# Patient Record
Sex: Female | Born: 1975 | Race: White | Hispanic: No | Marital: Married | Smoking: Current every day smoker
Health system: Southern US, Community
[De-identification: ages and names within clinical notes are randomized; demographics above are authoritative.]

## PROBLEM LIST (undated history)

## (undated) DIAGNOSIS — J45909 Unspecified asthma, uncomplicated: Secondary | ICD-10-CM

## (undated) DIAGNOSIS — K5792 Diverticulitis of intestine, part unspecified, without perforation or abscess without bleeding: Secondary | ICD-10-CM

## (undated) DIAGNOSIS — N200 Calculus of kidney: Secondary | ICD-10-CM

---

## 2003-02-10 HISTORY — PX: ABDOMINAL HYSTERECTOMY: SHX81

## 2005-05-29 ENCOUNTER — Encounter: Admission: RE | Admit: 2005-05-29 | Discharge: 2005-05-29 | Payer: Self-pay | Admitting: Family Medicine

## 2007-01-16 ENCOUNTER — Emergency Department (HOSPITAL_COMMUNITY): Admission: EM | Admit: 2007-01-16 | Discharge: 2007-01-16 | Payer: Self-pay | Admitting: Emergency Medicine

## 2009-09-03 ENCOUNTER — Emergency Department (HOSPITAL_BASED_OUTPATIENT_CLINIC_OR_DEPARTMENT_OTHER): Admission: EM | Admit: 2009-09-03 | Discharge: 2009-09-04 | Payer: Self-pay | Admitting: Emergency Medicine

## 2010-11-17 LAB — URINALYSIS, ROUTINE W REFLEX MICROSCOPIC
Bilirubin Urine: NEGATIVE
Glucose, UA: NEGATIVE
Ketones, ur: NEGATIVE
Leukocytes, UA: NEGATIVE
Nitrite: NEGATIVE
Protein, ur: NEGATIVE
Specific Gravity, Urine: 1.009
Urobilinogen, UA: 0.2
pH: 6

## 2010-11-17 LAB — I-STAT 8, (EC8 V) (CONVERTED LAB)
BUN: 6
Bicarbonate: 24.9 — ABNORMAL HIGH
Glucose, Bld: 74
HCT: 37
Hemoglobin: 12.6

## 2010-11-17 LAB — URINE MICROSCOPIC-ADD ON

## 2010-11-17 LAB — POCT I-STAT CREATININE: Creatinine, Ser: 0.7

## 2012-01-25 ENCOUNTER — Emergency Department (HOSPITAL_BASED_OUTPATIENT_CLINIC_OR_DEPARTMENT_OTHER)
Admission: EM | Admit: 2012-01-25 | Discharge: 2012-01-25 | Disposition: A | Discharge status: Home / Self Care | Payer: Self-pay | Attending: Emergency Medicine | Admitting: Emergency Medicine

## 2012-01-25 ENCOUNTER — Emergency Department (HOSPITAL_BASED_OUTPATIENT_CLINIC_OR_DEPARTMENT_OTHER): Payer: Self-pay

## 2012-01-25 ENCOUNTER — Encounter (HOSPITAL_BASED_OUTPATIENT_CLINIC_OR_DEPARTMENT_OTHER): Payer: Self-pay | Admitting: *Deleted

## 2012-01-25 DIAGNOSIS — R059 Cough, unspecified: Secondary | ICD-10-CM | POA: Insufficient documentation

## 2012-01-25 DIAGNOSIS — F17200 Nicotine dependence, unspecified, uncomplicated: Secondary | ICD-10-CM | POA: Insufficient documentation

## 2012-01-25 DIAGNOSIS — R071 Chest pain on breathing: Secondary | ICD-10-CM | POA: Insufficient documentation

## 2012-01-25 DIAGNOSIS — R05 Cough: Secondary | ICD-10-CM | POA: Insufficient documentation

## 2012-01-25 DIAGNOSIS — R0789 Other chest pain: Secondary | ICD-10-CM

## 2012-01-25 DIAGNOSIS — J45909 Unspecified asthma, uncomplicated: Secondary | ICD-10-CM | POA: Insufficient documentation

## 2012-01-25 HISTORY — DX: Unspecified asthma, uncomplicated: J45.909

## 2012-01-25 LAB — CBC WITH DIFFERENTIAL/PLATELET
Basophils Relative: 1 % (ref 0–1)
Eosinophils Absolute: 0.1 10*3/uL (ref 0.0–0.7)
Eosinophils Relative: 2 % (ref 0–5)
HCT: 37.7 % (ref 36.0–46.0)
Hemoglobin: 13.2 g/dL (ref 12.0–15.0)
MCH: 31.3 pg (ref 26.0–34.0)
MCHC: 35 g/dL (ref 30.0–36.0)
Monocytes Absolute: 0.7 10*3/uL (ref 0.1–1.0)
Monocytes Relative: 10 % (ref 3–12)
Neutro Abs: 4.2 10*3/uL (ref 1.7–7.7)
Neutrophils Relative %: 65 % (ref 43–77)
Platelets: 233 10*3/uL (ref 150–400)
RDW: 12.1 % (ref 11.5–15.5)
WBC: 6.5 10*3/uL (ref 4.0–10.5)

## 2012-01-25 LAB — BASIC METABOLIC PANEL
BUN: 11 mg/dL (ref 6–23)
CO2: 25 mEq/L (ref 19–32)
Chloride: 106 mEq/L (ref 96–112)
Creatinine, Ser: 0.8 mg/dL (ref 0.50–1.10)
GFR calc Af Amer: >90 mL/min (ref >90)
Potassium: 3.5 mEq/L (ref 3.5–5.1)

## 2012-01-25 LAB — TROPONIN I: Troponin I: <0.3 ng/mL (ref <0.30)

## 2012-01-25 MED ORDER — IBUPROFEN 800 MG PO TABS: 800.0000 mg | ORAL_TABLET | Freq: Three times a day (TID) | ORAL | Status: DC

## 2012-01-25 MED ORDER — OXYCODONE-ACETAMINOPHEN 5-325 MG PO TABS: 2.0000 | ORAL_TABLET | ORAL | Status: DC | PRN

## 2012-01-25 NOTE — ED Provider Notes (Signed)
History     CSN: [id number]  Arrival date & time 01/25/12  1706   First MD Initiated Contact with Patient 01/25/12 1747      Chief Complaint  Patient presents with  . Chest Pain  . Cough    (Consider location/radiation/quality/duration/timing/severity/associated sxs/prior treatment) Patient is a 36 y.o. female presenting with chest pain. The history is provided by the patient. No language interpreter was used.  Chest Pain The chest pain began more  than 1 month ago. Chest pain occurs intermittently. The chest pain is worsening. The pain is associated with stress. At its most intense, the pain is at 6/10. The pain is currently at 7/10. The severity of the pain is moderate. The quality of the pain is described as aching. The pain radiates to the left shoulder. Chest pain is worsened by certain positions. She tried aspirin for the symptoms. Risk factors include stress.  Pertinent negatives for past medical history include no CHF and no hypertension.    Pt reports increased stress.   Pt has had left sided chest pain for a month.   Pt began having pain in left arm and shoulder yesterday Past Medical History  Diagnosis Date  . Asthma     Past Surgical History  Procedure Date  . Abdominal hysterectomy     History reviewed. No pertinent family history.  History  Substance Use Topics  . Smoking status: Current Every Day Smoker -- 1.0 packs/day    Types: Cigarettes  . Smokeless tobacco: Not on file  . Alcohol Use: No    OB History    Grav Para Term Preterm Abortions TAB SAB Ect Mult Living                  Review of Systems  Cardiovascular: Positive for chest pain.  All other systems reviewed and are negative.    Allergies  Vicodin  Home Medications  No current outpatient prescriptions on file.  BP 132/88  Pulse 81  Temp 98.7 F (37.1 C)  Resp 16  Ht 5\' 7"  (1.702 m)  Wt 283 lb (128.368 kg)  BMI 44.32 kg/m2  SpO2 96%  Physical Exam  Nursing note and  vitals reviewed. Constitutional: She appears well-developed and well-nourished.  HENT:  Head: Normocephalic and atraumatic.  Right Ear: External ear normal.  Left Ear: External ear normal.  Nose: Nose normal.  Mouth/Throat: Oropharynx is clear and moist.  Eyes: Conjunctivae normal and EOM are normal. Pupils are equal, round, and reactive to light.  Neck: Normal range of motion. Neck supple.  Cardiovascular: Normal rate and normal heart sounds.   Pulmonary/Chest: Effort normal and breath sounds normal.  Abdominal: Soft. Bowel sounds are normal.  Musculoskeletal: Normal range of motion.  Neurological: She is alert.  Skin: Skin is warm.    ED Course  Procedures (including critical care time)  Labs Reviewed - No data to display Dg Chest 2 View  01/25/2012  *RADIOLOGY REPORT*  Clinical Data: Smoker with chest and left arm pain.  CHEST - 2 VIEW  Comparison: 06/13/2003.  Findings: Normal sized heart.  Clear lungs.  Mild thoracic spine degenerative changes.  IMPRESSION: No acute abnormality.   Original Report Authenticated By: Beckie Salts, M.D.      1. Chest wall pain       MDM   Date: 01/25/2012  Rate: 76  Rhythm: normal sinus rhythm  QRS Axis: normal  Intervals: normal  ST/T Wave abnormalities: nonspecific T wave changes  Conduction Disutrbances:none  Narrative  Interpretation:   Old EKG Reviewed: none available   Troponin negative,    Chest xray normal,   I doubt cardiac chest pain.   Pt given referrals for a primary MD.  Pt given rx for ibuprofen and percocet.        Lonia Skinner Blum, [place] 01/25/12 2013

## 2012-01-25 NOTE — ED Notes (Addendum)
Pt c/o URI symptoms x 1 month with chest tightness and upper back pain pain increases with deep breathing also c/o green sputum

## 2012-01-25 NOTE — ED Notes (Addendum)
Took 848mg  of aspirin at home today.

## 2012-01-25 NOTE — ED Notes (Signed)
Patient transported to X-ray 

## 2012-01-27 NOTE — ED Provider Notes (Signed)
Medical screening examination/treatment/procedure(s) were performed by non-physician practitioner and as supervising physician I was immediately available for consultation/collaboration.   Rocket Gunderson Y. Mora Pedraza, MD 01/27/12 2110 

## 2012-07-01 ENCOUNTER — Encounter (HOSPITAL_BASED_OUTPATIENT_CLINIC_OR_DEPARTMENT_OTHER): Payer: Self-pay | Admitting: Emergency Medicine

## 2012-07-01 ENCOUNTER — Emergency Department (HOSPITAL_BASED_OUTPATIENT_CLINIC_OR_DEPARTMENT_OTHER)
Admission: EM | Admit: 2012-07-01 | Discharge: 2012-07-01 | Disposition: A | Discharge status: Home / Self Care | Payer: Medicaid Other | Attending: Emergency Medicine | Admitting: Emergency Medicine

## 2012-07-01 DIAGNOSIS — M545 Low back pain, unspecified: Secondary | ICD-10-CM | POA: Insufficient documentation

## 2012-07-01 DIAGNOSIS — F17200 Nicotine dependence, unspecified, uncomplicated: Secondary | ICD-10-CM | POA: Insufficient documentation

## 2012-07-01 DIAGNOSIS — J45909 Unspecified asthma, uncomplicated: Secondary | ICD-10-CM | POA: Insufficient documentation

## 2012-07-01 LAB — URINALYSIS, ROUTINE W REFLEX MICROSCOPIC
Glucose, UA: NEGATIVE mg/dL
Nitrite: NEGATIVE

## 2012-07-01 LAB — URINE MICROSCOPIC-ADD ON

## 2012-07-01 MED ORDER — CYCLOBENZAPRINE HCL 10 MG PO TABS
10.0000 mg | ORAL_TABLET | Freq: Once | ORAL | Status: AC
  Administered 2012-07-01: 10 mg via ORAL
  Filled 2012-07-01: qty 1

## 2012-07-01 MED ORDER — KETOROLAC TROMETHAMINE 60 MG/2ML IM SOLN
60.0000 mg | Freq: Once | INTRAMUSCULAR | Status: AC
  Administered 2012-07-01: 60 mg via INTRAMUSCULAR
  Filled 2012-07-01: qty 2

## 2012-07-01 MED ORDER — CYCLOBENZAPRINE HCL 10 MG PO TABS: 10.0000 mg | ORAL_TABLET | Freq: Three times a day (TID) | ORAL | Status: DC | PRN

## 2012-07-01 NOTE — ED Notes (Signed)
Pt c/o lower back. Reports possible injury? A few days ago. States she took 1 percocet, 10mg  Valium, and Mobic 15mg  approx 0430 this am PTA, with little relief.

## 2012-07-01 NOTE — ED Notes (Signed)
MD at bedside. 
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
## 2012-08-25 ENCOUNTER — Encounter (HOSPITAL_BASED_OUTPATIENT_CLINIC_OR_DEPARTMENT_OTHER): Payer: Self-pay | Admitting: *Deleted

## 2012-08-25 ENCOUNTER — Emergency Department (HOSPITAL_BASED_OUTPATIENT_CLINIC_OR_DEPARTMENT_OTHER)
Admission: EM | Admit: 2012-08-25 | Discharge: 2012-08-25 | Disposition: A | Discharge status: Home / Self Care | Payer: Medicaid Other | Attending: Emergency Medicine | Admitting: Emergency Medicine

## 2012-08-25 DIAGNOSIS — F17200 Nicotine dependence, unspecified, uncomplicated: Secondary | ICD-10-CM | POA: Insufficient documentation

## 2012-08-25 DIAGNOSIS — J45909 Unspecified asthma, uncomplicated: Secondary | ICD-10-CM | POA: Insufficient documentation

## 2012-08-25 DIAGNOSIS — R0789 Other chest pain: Secondary | ICD-10-CM | POA: Insufficient documentation

## 2012-08-25 DIAGNOSIS — Z79899 Other long term (current) drug therapy: Secondary | ICD-10-CM | POA: Insufficient documentation

## 2012-08-25 DIAGNOSIS — J40 Bronchitis, not specified as acute or chronic: Secondary | ICD-10-CM

## 2012-08-25 MED ORDER — ALBUTEROL SULFATE HFA 108 (90 BASE) MCG/ACT IN AERS
INHALATION_SPRAY | RESPIRATORY_TRACT | Status: AC
  Administered 2012-08-25: 2 via RESPIRATORY_TRACT
  Filled 2012-08-25: qty 6.7

## 2012-08-25 MED ORDER — AZITHROMYCIN 250 MG PO TABS: ORAL_TABLET | ORAL | Status: DC

## 2012-08-25 MED ORDER — ALBUTEROL SULFATE HFA 108 (90 BASE) MCG/ACT IN AERS
2.0000 | INHALATION_SPRAY | RESPIRATORY_TRACT | Status: DC
  Administered 2012-08-25: 2 via RESPIRATORY_TRACT

## 2012-08-25 NOTE — Patient Instructions (Signed)
Instructed patient on the proper use of administering albuteral mdi via aerochamber patient tolerated well 

## 2012-08-25 NOTE — ED Notes (Signed)
C/o cough, wheezing, and congestion since Monday.

## 2012-08-25 NOTE — ED Notes (Signed)
MD at bedside. 

## 2012-08-25 NOTE — ED Provider Notes (Signed)
   History    CSN: [id number] Arrival date & time 08/25/12  0154  First MD Initiated Contact with Patient 08/25/12 0235     Chief Complaint  Patient presents with  . Cough   (Consider location/radiation/quality/duration/timing/severity/associated sxs/prior Treatment) HPI This is a 37 year old female with a three-day history of cough, wheezing and chest tightness. The symptoms have been moderate in severity and worse with exertion. The symptoms are also worse when she wakes up in the morning. She is not aware of having a fever but has felt hot at times. Her cough is productive of green sputum. She was evaluated by respiratory therapy was administered for puffs of albuterol prior to my evaluation. Wheezing heard by respiratory therapist resolved by the time of my evaluation. She had some nausea yesterday but none now. She denies vomiting or diarrhea.  Past Medical History  Diagnosis Date  . Asthma    Past Surgical History  Procedure Laterality Date  . Abdominal hysterectomy     History reviewed. No pertinent family history. History  Substance Use Topics  . Smoking status: Current Every Day Smoker -- 1.00 packs/day    Types: Cigarettes  . Smokeless tobacco: Not on file  . Alcohol Use: No   OB History   Grav Para Term Preterm Abortions TAB SAB Ect Mult Living                 Review of Systems  All other systems reviewed and are negative.    Allergies  Vicodin  Home Medications   Current Outpatient Rx  Name  Route  Sig  Dispense  Refill  . FLUoxetine (PROZAC) 20 MG capsule   Oral   Take 20 mg by mouth daily.         . cyclobenzaprine (FLEXERIL) 10 MG tablet   Oral   Take 1 tablet (10 mg total) by mouth 3 (three) times daily as needed for muscle spasms.   20 tablet   0   . ibuprofen (ADVIL,MOTRIN) 800 MG tablet   Oral   Take 1 tablet (800 mg total) by mouth 3 (three) times daily.   21 tablet   0   . oxyCODONE-acetaminophen (PERCOCET/ROXICET) 5-325 MG per  tablet   Oral   Take 2 tablets by mouth every 4 (four) hours as needed for pain.   10 tablet   0    BP 150/97  Pulse 75  Temp(Src) 98.7 F (37.1 C) (Oral)  Resp 20  Ht 5\' 7"  (1.702 m)  Wt 290 lb (131.543 kg)  BMI 45.41 kg/m2  SpO2 98%  Physical Exam General: Well-developed, well-nourished female in no acute distress; appearance consistent with age of record HENT: normocephalic, atraumatic Eyes: pupils equal round and reactive to light; extraocular muscles intact Neck: supple Heart: regular rate and rhythm Lungs: clear to auscultation bilaterally Abdomen: soft; obese; nontender; bowel sounds present Extremities: No deformity; full range of motion; pulses normal Neurologic: Awake, alert and oriented; motor function intact in all extremities and symmetric; no facial droop Skin: Warm and dry Psychiatric: Normal mood and affect    ED Course  Procedures (including critical care time)   MDM    Hanley Seamen, MD 08/25/12 [phone]

## 2013-12-28 ENCOUNTER — Emergency Department (HOSPITAL_BASED_OUTPATIENT_CLINIC_OR_DEPARTMENT_OTHER): Payer: Medicaid Other

## 2013-12-28 ENCOUNTER — Emergency Department (HOSPITAL_BASED_OUTPATIENT_CLINIC_OR_DEPARTMENT_OTHER)
Admission: EM | Admit: 2013-12-28 | Discharge: 2013-12-29 | Disposition: A | Discharge status: Home / Self Care | Payer: Self-pay | Attending: Emergency Medicine | Admitting: Emergency Medicine

## 2013-12-28 ENCOUNTER — Encounter (HOSPITAL_BASED_OUTPATIENT_CLINIC_OR_DEPARTMENT_OTHER): Payer: Self-pay | Admitting: Emergency Medicine

## 2013-12-28 DIAGNOSIS — Z791 Long term (current) use of non-steroidal anti-inflammatories (NSAID): Secondary | ICD-10-CM | POA: Insufficient documentation

## 2013-12-28 DIAGNOSIS — J45901 Unspecified asthma with (acute) exacerbation: Secondary | ICD-10-CM | POA: Insufficient documentation

## 2013-12-28 DIAGNOSIS — Z792 Long term (current) use of antibiotics: Secondary | ICD-10-CM | POA: Insufficient documentation

## 2013-12-28 DIAGNOSIS — R51 Headache: Secondary | ICD-10-CM | POA: Insufficient documentation

## 2013-12-28 DIAGNOSIS — Z3202 Encounter for pregnancy test, result negative: Secondary | ICD-10-CM | POA: Insufficient documentation

## 2013-12-28 DIAGNOSIS — R5383 Other fatigue: Secondary | ICD-10-CM | POA: Insufficient documentation

## 2013-12-28 DIAGNOSIS — Z79899 Other long term (current) drug therapy: Secondary | ICD-10-CM | POA: Insufficient documentation

## 2013-12-28 DIAGNOSIS — R509 Fever, unspecified: Secondary | ICD-10-CM

## 2013-12-28 DIAGNOSIS — K5732 Diverticulitis of large intestine without perforation or abscess without bleeding: Secondary | ICD-10-CM

## 2013-12-28 DIAGNOSIS — M549 Dorsalgia, unspecified: Secondary | ICD-10-CM | POA: Insufficient documentation

## 2013-12-28 DIAGNOSIS — R102 Pelvic and perineal pain: Secondary | ICD-10-CM

## 2013-12-28 DIAGNOSIS — R319 Hematuria, unspecified: Secondary | ICD-10-CM

## 2013-12-28 DIAGNOSIS — N941 Dyspareunia: Secondary | ICD-10-CM | POA: Insufficient documentation

## 2013-12-28 DIAGNOSIS — Z720 Tobacco use: Secondary | ICD-10-CM | POA: Insufficient documentation

## 2013-12-28 DIAGNOSIS — R300 Dysuria: Secondary | ICD-10-CM | POA: Insufficient documentation

## 2013-12-28 DIAGNOSIS — R1024 Suprapubic pain: Secondary | ICD-10-CM

## 2013-12-28 LAB — URINE MICROSCOPIC-ADD ON

## 2013-12-28 LAB — COMPREHENSIVE METABOLIC PANEL
ALBUMIN: 3.1 g/dL — AB (ref 3.5–5.2)
ALT: 7 U/L (ref 0–35)
ANION GAP: 13 (ref 5–15)
AST: 10 U/L (ref 0–37)
Alkaline Phosphatase: 74 U/L (ref 39–117)
BUN: 11 mg/dL (ref 6–23)
CALCIUM: 8.8 mg/dL (ref 8.4–10.5)
CO2: 21 mEq/L (ref 19–32)
CREATININE: 0.9 mg/dL (ref 0.50–1.10)
Chloride: 103 mEq/L (ref 96–112)
GFR calc Af Amer: >90 mL/min (ref >90)
GFR calc non Af Amer: 80 mL/min — ABNORMAL LOW (ref >90)
Glucose, Bld: 133 mg/dL — ABNORMAL HIGH (ref 70–99)
Potassium: 3.5 mEq/L — ABNORMAL LOW (ref 3.7–5.3)
Sodium: 137 mEq/L (ref 137–147)
TOTAL PROTEIN: 6.7 g/dL (ref 6.0–8.3)
Total Bilirubin: 0.5 mg/dL (ref 0.3–1.2)

## 2013-12-28 LAB — CBC WITH DIFFERENTIAL/PLATELET
Basophils Absolute: 0 10*3/uL (ref 0.0–0.1)
Basophils Relative: 0 % (ref 0–1)
EOS ABS: 0 10*3/uL (ref 0.0–0.7)
EOS PCT: 0 % (ref 0–5)
HCT: 38 % (ref 36.0–46.0)
HEMOGLOBIN: 12.8 g/dL (ref 12.0–15.0)
Lymphocytes Relative: 10 % — ABNORMAL LOW (ref 12–46)
Lymphs Abs: 1.1 10*3/uL (ref 0.7–4.0)
MCH: 32.7 pg (ref 26.0–34.0)
MCHC: 33.7 g/dL (ref 30.0–36.0)
MCV: 96.9 fL (ref 78.0–100.0)
MONO ABS: 1.1 10*3/uL — AB (ref 0.1–1.0)
MONOS PCT: 10 % (ref 3–12)
Neutro Abs: 8.8 10*3/uL — ABNORMAL HIGH (ref 1.7–7.7)
Neutrophils Relative %: 80 % — ABNORMAL HIGH (ref 43–77)
Platelets: 149 10*3/uL — ABNORMAL LOW (ref 150–400)
RBC: 3.92 MIL/uL (ref 3.87–5.11)
RDW: 13.4 % (ref 11.5–15.5)
WBC: 11 10*3/uL — ABNORMAL HIGH (ref 4.0–10.5)

## 2013-12-28 LAB — URINALYSIS, ROUTINE W REFLEX MICROSCOPIC
Bilirubin Urine: NEGATIVE
Glucose, UA: NEGATIVE mg/dL
Ketones, ur: NEGATIVE mg/dL
Leukocytes, UA: NEGATIVE
Nitrite: NEGATIVE
Protein, ur: 100 mg/dL — AB
SPECIFIC GRAVITY, URINE: 1.022 (ref 1.005–1.030)
Urobilinogen, UA: 1 mg/dL (ref 0.0–1.0)
pH: 5 (ref 5.0–8.0)

## 2013-12-28 LAB — LIPASE, BLOOD: Lipase: 7 U/L — ABNORMAL LOW (ref 11–59)

## 2013-12-28 LAB — PREGNANCY, URINE: PREG TEST UR: NEGATIVE

## 2013-12-28 MED ORDER — OXYCODONE-ACETAMINOPHEN 5-325 MG PO TABS: 1.0000 | ORAL_TABLET | Freq: Four times a day (QID) | ORAL | Status: DC | PRN

## 2013-12-28 MED ORDER — ONDANSETRON HCL 4 MG/2ML IJ SOLN
4.0000 mg | Freq: Once | INTRAMUSCULAR | Status: AC
  Administered 2013-12-28: 4 mg via INTRAVENOUS
  Filled 2013-12-28: qty 2

## 2013-12-28 MED ORDER — MORPHINE SULFATE 4 MG/ML IJ SOLN
4.0000 mg | Freq: Once | INTRAMUSCULAR | Status: AC
  Administered 2013-12-28: 4 mg via INTRAVENOUS
  Filled 2013-12-28: qty 1

## 2013-12-28 MED ORDER — CIPROFLOXACIN HCL 500 MG PO TABS: 500.0000 mg | ORAL_TABLET | Freq: Two times a day (BID) | ORAL | Status: DC

## 2013-12-28 MED ORDER — OXYCODONE-ACETAMINOPHEN 5-325 MG PO TABS
1.0000 | ORAL_TABLET | Freq: Once | ORAL | Status: AC
Start: 2013-12-29 — End: 2013-12-29
  Administered 2013-12-29: 1 via ORAL
  Filled 2013-12-28: qty 1

## 2013-12-28 MED ORDER — IBUPROFEN 400 MG PO TABS
600.0000 mg | ORAL_TABLET | Freq: Once | ORAL | Status: AC
  Administered 2013-12-28: 600 mg via ORAL
  Filled 2013-12-28 (×2): qty 1

## 2013-12-28 MED ORDER — ONDANSETRON HCL 4 MG PO TABS: 4.0000 mg | ORAL_TABLET | Freq: Four times a day (QID) | ORAL | Status: DC

## 2013-12-28 MED ORDER — SODIUM CHLORIDE 0.9 % IV BOLUS (SEPSIS)
1000.0000 mL | Freq: Once | INTRAVENOUS | Status: AC
  Administered 2013-12-28: 1000 mL via INTRAVENOUS

## 2013-12-28 MED ORDER — METRONIDAZOLE 500 MG PO TABS: 500.0000 mg | ORAL_TABLET | Freq: Two times a day (BID) | ORAL | Status: DC

## 2013-12-28 MED ORDER — CIPROFLOXACIN IN D5W 400 MG/200ML IV SOLN
400.0000 mg | Freq: Once | INTRAVENOUS | Status: AC
  Administered 2013-12-29: 400 mg via INTRAVENOUS
  Filled 2013-12-28: qty 200

## 2013-12-28 MED ORDER — METRONIDAZOLE IN NACL 5-0.79 MG/ML-% IV SOLN
500.0000 mg | Freq: Once | INTRAVENOUS | Status: AC
  Administered 2013-12-28: 500 mg via INTRAVENOUS
  Filled 2013-12-28: qty 100

## 2013-12-28 NOTE — ED Notes (Signed)
Pt states that she has lower abd pain since yesterday with n/v.

## 2013-12-28 NOTE — ED Provider Notes (Signed)
CSN: [id number]     Arrival date & time 12/28/13  2059 History   First MD Initiated Contact with Patient 12/28/13 2123     This chart was scribed for Toy Cookey, MD by Arlan Organ, ED Scribe. This patient was seen in room MH04/MH04 and the patient's care was started 12:21 AM.   Chief Complaint  Patient presents with  . Abdominal Pain   Patient is a 38 y.o. female presenting with abdominal pain.  Abdominal Pain Pain radiates to:  Does not radiate Pain severity:  Moderate Onset quality:  Gradual Duration:  1 day Timing:  Constant Progression:  Unchanged Chronicity:  New Relieved by:  None tried Worsened by:  Movement Ineffective treatments:  Not moving and lying down Associated symptoms: chills, cough, dysuria, fatigue, fever, hematuria, nausea, shortness of breath and vomiting   Associated symptoms: no chest pain and no diarrhea   Cough:    Cough characteristics:  Productive   Severity:  Moderate   Onset quality:  Gradual   Duration:  1 day   Timing:  Constant   Progression:  Unchanged Dysuria:    Severity:  Moderate   Duration:  1 day   Timing:  Constant   Progression:  Unchanged Fatigue:    Severity:  Moderate   Duration:  1 day   Timing:  Constant   Progression:  Unchanged Fever:    Duration:  1 day   Timing:  Constant   Max temp PTA (F):  102   Progression:  Unchanged Nausea:    Severity:  Moderate   Duration:  1 day   Timing:  Constant   Progression:  Unchanged Shortness of breath:    Severity:  Mild   Duration:  1 day   Timing:  Intermittent   Progression:  Unchanged Vomiting:    Severity:  Moderate   Duration:  1 day   Timing:  Intermittent   Progression:  Unchanged   HPI Comments: Arihana Ambrocio is a 38 y.o. female with a PMHx of kidney infections and asthma who presents to the Emergency Department complaining of abdominal pain onset last night. Pain is exacerbated when urinating/sitting and alleviated when laying down. Pt also reports nausea,  chills, mild hematuria, cough, dysuria, congestion, nausea, HA, vomiting, and fever of 102 at its highest (onset this evening). Pt also mentions lower back pain that is constant at this time. She reports recently new pain with intercourse. She denies any diarrhea, vaginal bleeding, vaginal discharge. Last bowel movements yesterday. Ms. Dohn admits to a case of kidney stones 1 year ago. PSHx includes complete abdominal hysterectomy. She is an every day smoker.   Past Medical History  Diagnosis Date  . Asthma    Past Surgical History  Procedure Laterality Date  . Abdominal hysterectomy     History reviewed. No pertinent family history. History  Substance Use Topics  . Smoking status: Current Every Day Smoker -- 1.00 packs/day    Types: Cigarettes  . Smokeless tobacco: Not on file  . Alcohol Use: No   OB History    No data available     Review of Systems  Constitutional: Positive for fever, chills and fatigue.  HENT: Positive for congestion.   Respiratory: Positive for cough and shortness of breath.   Cardiovascular: Negative for chest pain.  Gastrointestinal: Positive for nausea, vomiting and abdominal pain. Negative for diarrhea.  Genitourinary: Positive for dysuria, hematuria and dyspareunia.  Musculoskeletal: Positive for back pain.  Neurological: Positive for headaches.  Allergies  Vicodin  Home Medications   Prior to Admission medications   Medication Sig Start Date End Date Taking? Authorizing Provider  azithromycin (ZITHROMAX) 250 MG tablet Take 2 tablets daily for 3 days. 08/25/12   Carlisle BeersJohn L Molpus, MD  ciprofloxacin (CIPRO) 500 MG tablet Take 1 tablet (500 mg total) by mouth 2 (two) times daily. One po bid x 7 days 12/28/13   Toy CookeyMegan Docherty, MD  cyclobenzaprine (FLEXERIL) 10 MG tablet Take 1 tablet (10 mg total) by mouth 3 (three) times daily as needed for muscle spasms. 07/01/12   Geoffery Lyonsouglas Delo, MD  FLUoxetine (PROZAC) 20 MG capsule Take 20 mg by mouth daily.     Historical Provider, MD  ibuprofen (ADVIL,MOTRIN) 800 MG tablet Take 1 tablet (800 mg total) by mouth 3 (three) times daily. 01/25/12   Elson AreasLeslie K Sofia, PA-C  metroNIDAZOLE (FLAGYL) 500 MG tablet Take 1 tablet (500 mg total) by mouth 2 (two) times daily. One po bid x 7 days 12/28/13   Toy CookeyMegan Docherty, MD  ondansetron (ZOFRAN) 4 MG tablet Take 1 tablet (4 mg total) by mouth every 6 (six) hours. 12/28/13   Toy CookeyMegan Docherty, MD  oxyCODONE-acetaminophen (PERCOCET/ROXICET) 5-325 MG per tablet Take 1-2 tablets by mouth every 6 (six) hours as needed for moderate pain or severe pain. 12/28/13   Toy CookeyMegan Docherty, MD   Triage Vitals: BP 102/48 mmHg  Pulse 86  Temp(Src) 99.8 F (37.7 C) (Oral)  Resp 18  Ht 5\' 7"  (1.702 m)  Wt 300 lb (136.079 kg)  BMI 46.98 kg/m2  SpO2 94%   Physical Exam  Abdominal: There is tenderness in the suprapubic area. There is no rebound, no guarding and no CVA tenderness.    ED Course  Procedures (including critical care time)  DIAGNOSTIC STUDIES: Oxygen Saturation is 96% on RA, adequate by my interpretation.    COORDINATION OF CARE: 12:21 AM- Will give morphine injection, Zofran, fluids, and Ibuprofen. Will order CT renal stone study, CMP, lipase, urinalysis, pregnancy urine, and CBC. Discussed treatment plan with pt at bedside and pt agreed to plan.     Labs Review Labs Reviewed  CBC WITH DIFFERENTIAL - Abnormal; Notable for the following:    WBC 11.0 (*)    Platelets 149 (*)    Neutrophils Relative % 80 (*)    Neutro Abs 8.8 (*)    Lymphocytes Relative 10 (*)    Monocytes Absolute 1.1 (*)    All other components within normal limits  COMPREHENSIVE METABOLIC PANEL - Abnormal; Notable for the following:    Potassium 3.5 (*)    Glucose, Bld 133 (*)    Albumin 3.1 (*)    GFR calc non Af Amer 80 (*)    All other components within normal limits  LIPASE, BLOOD - Abnormal; Notable for the following:    Lipase 7 (*)    All other components within normal limits   URINALYSIS, ROUTINE W REFLEX MICROSCOPIC - Abnormal; Notable for the following:    Color, Urine AMBER (*)    APPearance CLOUDY (*)    Hgb urine dipstick LARGE (*)    Protein, ur 100 (*)    All other components within normal limits  URINE MICROSCOPIC-ADD ON - Abnormal; Notable for the following:    Squamous Epithelial / LPF MANY (*)    Bacteria, UA MANY (*)    All other components within normal limits  PREGNANCY, URINE    Imaging Review Ct Renal Stone Study  12/28/2013   CLINICAL DATA:  Lower pelvic pain, nausea, constipation, pain when attempting to urinate, have a bowel movement or passed gas, suprapubic pain, fever, hematuria, smoker  EXAM: CT ABDOMEN AND PELVIS WITHOUT CONTRAST  TECHNIQUE: Multidetector CT imaging of the abdomen and pelvis was performed following the standard protocol without IV contrast. Sagittal and coronal MPR images reconstructed from axial data set.  COMPARISON:  None  FINDINGS: Lung bases clear.  Two tiny nonobstructing LEFT renal calculi.  Liver, spleen, pancreas, kidneys, and adrenal glands otherwise normal for noncontrast technique.  Normal appendix.  Inflammatory process in pelvis centered at the distal sigmoid colon, which demonstrates wall thickening and pericolic inflammatory changes.  Questionable visualization of a single sigmoid colonic diverticulum.  Findings may either represent acute diverticulitis or colitis with significant pericolic inflammatory changes, favor diverticulitis.  No evidence of abscess.  Coronal images demonstrate questionably a single tiny focus of extraluminal gas in the pelvis image 55.  Bladder decompressed.  Scattered pelvic phleboliths.  Uterus surgically absent with nonvisualization of ovaries.  Stomach and small bowel loops normal.  Additional colonic diverticulum noted at hepatic flexure image 33.  No mass, adenopathy, or free intraperitoneal air.  No hernia or acute bone lesions.  IMPRESSION: Nonobstructing LEFT renal calculi.   Inflammatory process centered at the distal sigmoid colon where wall thickening and pericolic and infiltrative changes are identified, question new with a single focus of extraluminal gas.  Findings favor sigmoid diverticulitis with a single focus of mesenteric gas, colitis considered a less likely etiology.  No evidence of abscess or free intraperitoneal air.   Electronically Signed   By: Ulyses SouthwardMark  Boles M.D.   On: 12/28/2013 23:01     EKG Interpretation None      MDM   Final diagnoses:  Suprapubic pain  Hematuria  Fever  Diverticulitis of large intestine without perforation or abscess without bleeding    Pt is a 38 y.o. female with Pmhx as above including total hysterectomy who presents with 1 day of suprapubic pain, low back pain dysuria, hematuria, fever, chills, body aches.  On PE,  Patient afebrile, tachycardic but nontoxic appearing..  She is suprapubic tenderness palpation without rebound or guarding.  Bowel sounds are normal.  She has no CVA tenderness.  UA with large blood, but negative nitrites and negative leukocytes.  Given this did not show source of fever.  CT abdomen and pelvis ordered which showed a inflammatory process centered at the distal sigmoid colon with wall thickening and pericolic and infiltrative changes.  She also has a questionable single focus of extraluminal gas but no free intraperitoneal air or abscess visualized.  Findings per radiology favored a sigmoid diverticulitis or, less likely colitis.  Patient given IV fluids, ibuprofen with improvement of heart rate and temperature.  She's also been given 2 doses of IV morphine as well as IV Cipro and Flagyl.  She will be discharged home with by mouth Cipro, Flagyl, Percocet for pain, Zofran for nausea.  Strict return precautions given for new or worsening symptoms including worsening pain, failure symptoms improve, inability to tolerate liquids.      I personally performed the services described in this documentation,  which was scribed in my presence. The recorded information has been reviewed and is accurate.    Toy CookeyMegan Docherty, MD 12/29/13 [phone]

## 2013-12-28 NOTE — Discharge Instructions (Signed)

## 2013-12-28 NOTE — ED Notes (Signed)
Patient transported to CT 

## 2013-12-29 NOTE — ED Provider Notes (Signed)
Pt improved CT results did not show evidence of perforation She is taking PO without vomiting She appears comfortable and reports pain improved We discussed strict ER return precautions BP 115/62 mmHg  Pulse 70  Temp(Src) 98.2 F (36.8 C) (Oral)  Resp 18  Ht 5\' 7"  (1.702 m)  Wt 300 lb (136.079 kg)  BMI 46.98 kg/m2  SpO2 94%   Joya Gaskinsonald W Aunna Snooks, MD 12/29/13 [phone]

## 2014-01-04 ENCOUNTER — Inpatient Hospital Stay (HOSPITAL_COMMUNITY): Payer: Medicaid Other

## 2014-01-04 ENCOUNTER — Inpatient Hospital Stay (HOSPITAL_COMMUNITY)
Admission: EM | Admit: 2014-01-04 | Discharge: 2014-01-09 | DRG: 392 | Disposition: A | Discharge status: Home / Self Care | Payer: Medicaid Other | Attending: Internal Medicine | Admitting: Internal Medicine

## 2014-01-04 ENCOUNTER — Emergency Department (HOSPITAL_COMMUNITY): Payer: Medicaid Other

## 2014-01-04 ENCOUNTER — Encounter (HOSPITAL_COMMUNITY): Payer: Self-pay | Admitting: Emergency Medicine

## 2014-01-04 DIAGNOSIS — Z6841 Body Mass Index (BMI) 40.0 and over, adult: Secondary | ICD-10-CM | POA: Diagnosis not present

## 2014-01-04 DIAGNOSIS — N200 Calculus of kidney: Secondary | ICD-10-CM | POA: Diagnosis present

## 2014-01-04 DIAGNOSIS — Z720 Tobacco use: Secondary | ICD-10-CM

## 2014-01-04 DIAGNOSIS — K630 Abscess of intestine: Secondary | ICD-10-CM

## 2014-01-04 DIAGNOSIS — R52 Pain, unspecified: Secondary | ICD-10-CM

## 2014-01-04 DIAGNOSIS — Z90710 Acquired absence of both cervix and uterus: Secondary | ICD-10-CM

## 2014-01-04 DIAGNOSIS — K5720 Diverticulitis of large intestine with perforation and abscess without bleeding: Secondary | ICD-10-CM | POA: Diagnosis present

## 2014-01-04 DIAGNOSIS — J45909 Unspecified asthma, uncomplicated: Secondary | ICD-10-CM | POA: Diagnosis present

## 2014-01-04 DIAGNOSIS — E876 Hypokalemia: Secondary | ICD-10-CM | POA: Diagnosis present

## 2014-01-04 DIAGNOSIS — F329 Major depressive disorder, single episode, unspecified: Secondary | ICD-10-CM | POA: Diagnosis present

## 2014-01-04 DIAGNOSIS — J4520 Mild intermittent asthma, uncomplicated: Secondary | ICD-10-CM

## 2014-01-04 DIAGNOSIS — R1032 Left lower quadrant pain: Secondary | ICD-10-CM | POA: Diagnosis present

## 2014-01-04 DIAGNOSIS — Z888 Allergy status to other drugs, medicaments and biological substances status: Secondary | ICD-10-CM

## 2014-01-04 DIAGNOSIS — K5792 Diverticulitis of intestine, part unspecified, without perforation or abscess without bleeding: Secondary | ICD-10-CM | POA: Diagnosis present

## 2014-01-04 HISTORY — DX: Diverticulitis of intestine, part unspecified, without perforation or abscess without bleeding: K57.92

## 2014-01-04 HISTORY — DX: Calculus of kidney: N20.0

## 2014-01-04 LAB — URINALYSIS, ROUTINE W REFLEX MICROSCOPIC
BILIRUBIN URINE: NEGATIVE
GLUCOSE, UA: NEGATIVE mg/dL
KETONES UR: NEGATIVE mg/dL
Nitrite: NEGATIVE
PH: 5.5 (ref 5.0–8.0)
PROTEIN: 30 mg/dL — AB
Specific Gravity, Urine: 1.025 (ref 1.005–1.030)
Urobilinogen, UA: 0.2 mg/dL (ref 0.0–1.0)

## 2014-01-04 LAB — CBC WITH DIFFERENTIAL/PLATELET
BASOS ABS: 0 10*3/uL (ref 0.0–0.1)
Basophils Relative: 0 % (ref 0–1)
EOS PCT: 3 % (ref 0–5)
Eosinophils Absolute: 0.2 10*3/uL (ref 0.0–0.7)
HCT: 36.7 % (ref 36.0–46.0)
Hemoglobin: 12.4 g/dL (ref 12.0–15.0)
LYMPHS PCT: 11 % — AB (ref 12–46)
Lymphs Abs: 0.9 10*3/uL (ref 0.7–4.0)
MCH: 32 pg (ref 26.0–34.0)
MCHC: 33.8 g/dL (ref 30.0–36.0)
MCV: 94.6 fL (ref 78.0–100.0)
MONO ABS: 1 10*3/uL (ref 0.1–1.0)
Monocytes Relative: 12 % (ref 3–12)
Neutro Abs: 6.4 10*3/uL (ref 1.7–7.7)
Neutrophils Relative %: 74 % (ref 43–77)
Platelets: 336 10*3/uL (ref 150–400)
RBC: 3.88 MIL/uL (ref 3.87–5.11)
RDW: 13.2 % (ref 11.5–15.5)
WBC: 8.5 10*3/uL (ref 4.0–10.5)

## 2014-01-04 LAB — URINE MICROSCOPIC-ADD ON

## 2014-01-04 LAB — I-STAT CHEM 8, ED
BUN: 4 mg/dL — AB (ref 6–23)
CALCIUM ION: 1.11 mmol/L — AB (ref 1.12–1.23)
Chloride: 98 mEq/L (ref 96–112)
Creatinine, Ser: 0.8 mg/dL (ref 0.50–1.10)
GLUCOSE: 116 mg/dL — AB (ref 70–99)
HCT: 38 % (ref 36.0–46.0)
Hemoglobin: 12.9 g/dL (ref 12.0–15.0)
Potassium: 3.5 mEq/L — ABNORMAL LOW (ref 3.7–5.3)
Sodium: 137 mEq/L (ref 137–147)
TCO2: 28 mmol/L (ref 0–100)

## 2014-01-04 MED ORDER — ONDANSETRON HCL 4 MG/2ML IJ SOLN
4.0000 mg | Freq: Four times a day (QID) | INTRAMUSCULAR | Status: DC | PRN
  Administered 2014-01-04 – 2014-01-07 (×3): 4 mg via INTRAVENOUS
  Filled 2014-01-04 (×3): qty 2

## 2014-01-04 MED ORDER — ONDANSETRON HCL 4 MG PO TABS: 4.0000 mg | ORAL_TABLET | Freq: Four times a day (QID) | ORAL | Status: DC | PRN

## 2014-01-04 MED ORDER — ACETAMINOPHEN 650 MG RE SUPP: 650.0000 mg | Freq: Four times a day (QID) | RECTAL | Status: DC | PRN

## 2014-01-04 MED ORDER — MIDAZOLAM HCL 2 MG/2ML IJ SOLN
INTRAMUSCULAR | Status: AC | PRN
  Administered 2014-01-04 (×4): 1 mg via INTRAVENOUS

## 2014-01-04 MED ORDER — HYDROMORPHONE HCL 1 MG/ML IJ SOLN
1.0000 mg | Freq: Once | INTRAMUSCULAR | Status: AC
  Administered 2014-01-04: 1 mg via INTRAVENOUS
  Filled 2014-01-04: qty 1

## 2014-01-04 MED ORDER — PIPERACILLIN-TAZOBACTAM 3.375 G IVPB
3.3750 g | Freq: Once | INTRAVENOUS | Status: AC
  Administered 2014-01-04: 3.375 g via INTRAVENOUS
  Filled 2014-01-04: qty 50

## 2014-01-04 MED ORDER — ACETAMINOPHEN 325 MG PO TABS: 650.0000 mg | ORAL_TABLET | Freq: Four times a day (QID) | ORAL | Status: DC | PRN

## 2014-01-04 MED ORDER — SODIUM CHLORIDE 0.9 % IV SOLN
Freq: Once | INTRAVENOUS | Status: AC
  Administered 2014-01-04: 03:00:00 via INTRAVENOUS

## 2014-01-04 MED ORDER — ONDANSETRON HCL 4 MG/2ML IJ SOLN
4.0000 mg | Freq: Once | INTRAMUSCULAR | Status: AC
  Administered 2014-01-04: 4 mg via INTRAVENOUS
  Filled 2014-01-04: qty 2

## 2014-01-04 MED ORDER — IOHEXOL 300 MG/ML  SOLN
100.0000 mL | Freq: Once | INTRAMUSCULAR | Status: AC | PRN
  Administered 2014-01-04: 100 mL via INTRAVENOUS

## 2014-01-04 MED ORDER — ALUM & MAG HYDROXIDE-SIMETH 200-200-20 MG/5ML PO SUSP
30.0000 mL | Freq: Four times a day (QID) | ORAL | Status: DC | PRN
Start: 2014-01-04 — End: 2014-01-09

## 2014-01-04 MED ORDER — OXYCODONE HCL 5 MG PO TABS
5.0000 mg | ORAL_TABLET | ORAL | Status: DC | PRN
  Administered 2014-01-04 – 2014-01-09 (×3): 5 mg via ORAL
  Filled 2014-01-04 (×3): qty 1

## 2014-01-04 MED ORDER — DIPHENHYDRAMINE HCL 50 MG/ML IJ SOLN
25.0000 mg | Freq: Three times a day (TID) | INTRAMUSCULAR | Status: DC | PRN
Start: 2014-01-04 — End: 2014-01-09
  Administered 2014-01-04: 25 mg via INTRAVENOUS
  Filled 2014-01-04: qty 1

## 2014-01-04 MED ORDER — FENTANYL CITRATE 0.05 MG/ML IJ SOLN
INTRAMUSCULAR | Status: AC | PRN
  Administered 2014-01-04 (×5): 25 ug via INTRAVENOUS
  Administered 2014-01-04: 50 ug via INTRAVENOUS

## 2014-01-04 MED ORDER — SODIUM CHLORIDE 0.9 % IV SOLN: INTRAVENOUS | Status: DC

## 2014-01-04 MED ORDER — HYDROMORPHONE HCL 1 MG/ML IJ SOLN
0.5000 mg | INTRAMUSCULAR | Status: DC | PRN
  Administered 2014-01-04 – 2014-01-09 (×16): 1 mg via INTRAVENOUS
  Filled 2014-01-04 (×16): qty 1

## 2014-01-04 MED ORDER — IOHEXOL 300 MG/ML  SOLN
50.0000 mL | Freq: Once | INTRAMUSCULAR | Status: AC | PRN
  Administered 2014-01-04: 50 mL via ORAL

## 2014-01-04 MED ORDER — SODIUM CHLORIDE 0.9 % IV SOLN
INTRAVENOUS | Status: DC
  Administered 2014-01-04 – 2014-01-07 (×6): via INTRAVENOUS

## 2014-01-04 MED ORDER — ONDANSETRON HCL 4 MG/2ML IJ SOLN
4.0000 mg | Freq: Three times a day (TID) | INTRAMUSCULAR | Status: DC | PRN
Start: 2014-01-04 — End: 2014-01-04

## 2014-01-04 MED ORDER — PIPERACILLIN-TAZOBACTAM 3.375 G IVPB
3.3750 g | Freq: Three times a day (TID) | INTRAVENOUS | Status: DC
  Administered 2014-01-04 – 2014-01-09 (×15): 3.375 g via INTRAVENOUS
  Filled 2014-01-04 (×17): qty 50

## 2014-01-04 MED ORDER — HYDROMORPHONE HCL 1 MG/ML IJ SOLN: 1.0000 mg | INTRAMUSCULAR | Status: DC | PRN

## 2014-01-04 NOTE — ED Notes (Signed)
NP at bedside.

## 2014-01-04 NOTE — Procedures (Signed)
Successful CT guided needle aspiration only of the pelvic diverticular abscess. 35 cc pus aspirated to decompress the abscess Drain not inserted due to close proximity of needle access to the rectum and depth of 20cm(risk of bowel wall injury) GS/cx sent

## 2014-01-04 NOTE — H&P (Signed)
[facility] Admission History and Physical       Sonia Heritagemanda Anfinson ZOX:[id number] DOB: 1975/11/15 DOA: 01/04/2014  Referring physician: EDP PCP: No primary care provider on file.  Specialists:   Chief Complaint:  LLQ ABD Pain  HPI: Sonia Bennett is a 38 y.o. female with a history of Asthma and Depression who presents to the ED with complaints of LLQ and lower ABD Pain x 1 week.  She had been seen in the ED on 11/19 and found to have a UTI and Rrenal stones and Diverticulitis and was placed on Bactrim RX but she returned tonight due to continued pain and nausea.   She denied having any fevers or chills or diarrhea .    A CT scan of the ABD was performed and revealed a Diverticular Abscess and she was placed on IV Zosyn and referred for medical admission.   General Surgery was consulted.     Review of Systems:  Constitutional: No Weight Loss, No Weight Gain, Night Sweats, Fevers, Chills, Dizziness, Fatigue, or Generalized Weakness HEENT: No Headaches, Difficulty Swallowing,Tooth/Dental Problems,Sore Throat,  No Sneezing, Rhinitis, Ear Ache, Nasal Congestion, or Post Nasal Drip,  Cardio-vascular:  No Chest pain, Orthopnea, PND, Edema in Lower Extremities, Anasarca, Dizziness, Palpitations  Resp: No Dyspnea, No DOE, No Productive Cough, No Non-Productive Cough, No Hemoptysis, No Wheezing.    GI: No Heartburn, Indigestion, +Abdominal Pain, +Nausea, Vomiting, Diarrhea, Hematemesis, Hematochezia, Melena, Change in Bowel Habits,  Loss of Appetite  GU: No Dysuria, Change in Color of Urine, No Urgency or Frequency, No Flank pain.  Musculoskeletal: No Joint Pain or Swelling, No Decreased Range of Motion, No Back Pain.  Neurologic: No Syncope, No Seizures, Muscle Weakness, Paresthesia, Vision Disturbance or Loss, No Diplopia, No Vertigo, No Difficulty Walking,  Skin: No Rash or Lesions. Psych: No Change in Mood or Affect, No Depression or Anxiety, No Memory loss, No Confusion, or  Hallucinations   Past Medical History  Diagnosis Date  . Asthma   . Kidney calculi   . Diverticulitis       Past Surgical History  Procedure Laterality Date  . Abdominal hysterectomy         Prior to Admission medications   Medication Sig Start Date End Date Taking? Authorizing Provider  ciprofloxacin (CIPRO) 500 MG tablet Take 1 tablet (500 mg total) by mouth 2 (two) times daily. One po bid x 7 days 12/28/13  Yes Toy CookeyMegan Docherty, MD  metroNIDAZOLE (FLAGYL) 500 MG tablet Take 1 tablet (500 mg total) by mouth 2 (two) times daily. One po bid x 7 days 12/28/13  Yes Toy CookeyMegan Docherty, MD  oxyCODONE-acetaminophen (PERCOCET/ROXICET) 5-325 MG per tablet Take 1-2 tablets by mouth every 6 (six) hours as needed for moderate pain or severe pain. 12/28/13  Yes Toy CookeyMegan Docherty, MD  promethazine (PHENERGAN) 25 MG tablet Take 25 mg by mouth every 6 (six) hours as needed for nausea or vomiting.   Yes Historical Provider, MD  azithromycin (ZITHROMAX) 250 MG tablet Take 2 tablets daily for 3 days. Patient not taking: Reported on 01/04/2014 08/25/12   Carlisle BeersJohn L Molpus, MD  cyclobenzaprine (FLEXERIL) 10 MG tablet Take 1 tablet (10 mg total) by mouth 3 (three) times daily as needed for muscle spasms. Patient not taking: Reported on 01/04/2014 07/01/12   Geoffery Lyonsouglas Delo, MD  ibuprofen (ADVIL,MOTRIN) 800 MG tablet Take 1 tablet (800 mg total) by mouth 3 (three) times daily. Patient not taking: Reported on 01/04/2014 01/25/12   Elson AreasLeslie K Sofia, PA-C  ondansetron [facility](ZOFRAN)  4 MG tablet Take 1 tablet (4 mg total) by mouth every 6 (six) hours. 12/28/13   Toy CookeyMegan Docherty, MD      Allergies  Allergen Reactions  . Vicodin [Hydrocodone-Acetaminophen] Hives     Social History:  reports that she has been smoking Cigarettes.  She has been smoking about 1.00 pack per day. She does not have any smokeless tobacco history on file. She reports that she does not drink alcohol or use illicit drugs.     No family history on file.      Physical Exam:  GEN:  Pleasant Morbid Obese 38 y.o. Caucasian female examined  and in no acute distress; cooperative with exam Filed Vitals:   01/04/14 0146 01/04/14 0355 01/04/14 0539  BP: 108/79 100/45 118/44  Pulse: 98 77 76  Temp: 99.3 F (37.4 C)    Resp: 20 15 16   SpO2: 96% 94% 93%   Blood pressure 118/44, pulse 76, temperature 99.3 F (37.4 C), resp. rate 16, SpO2 93 %. PSYCH: She is alert and oriented x4; does not appear anxious does not appear depressed; affect is normal HEENT: Normocephalic and Atraumatic, Mucous membranes pink; PERRLA; EOM intact; Fundi:  Benign;  No scleral icterus, Nares: Patent, Oropharynx: Clear, Fair Dentition,    Neck:  FROM, No Cervical Lymphadenopathy nor Thyromegaly or Carotid Bruit; No JVD; Breasts:: Not examined CHEST WALL: No tenderness CHEST: Normal respiration, clear to auscultation bilaterally HEART: Regular rate and rhythm; no murmurs rubs or gallops BACK: No kyphosis or scoliosis; No CVA tenderness ABDOMEN: Positive Bowel Sounds,  Obese, Soft + LLQ Tenderness, No Rebound No Guarding, Non-Tender; No Masses, No Organomegaly, +Pannus; No Intertriginous candida. Rectal Exam: Not done EXTREMITIES: No Cyanosis, Clubbing, or Edema; No Ulcerations. Genitalia: not examined PULSES: 2+ and symmetric SKIN: Normal hydration no rash or ulceration CNS:  Alert and Oriented x 4, No Focal Deficits Vascular: pulses palpable throughout    Labs on Admission:  Basic Metabolic Panel:  Recent Labs Lab 12/28/13 2149 01/04/14 0302  NA 137 137  K 3.5* 3.5*  CL 103 98  CO2 21  --   GLUCOSE 133* 116*  BUN 11 4*  CREATININE 0.90 0.80  CALCIUM 8.8  --    Liver Function Tests:  Recent Labs Lab 12/28/13 2149  AST 10  ALT 7  ALKPHOS 74  BILITOT 0.5  PROT 6.7  ALBUMIN 3.1*    Recent Labs Lab 12/28/13 2149  LIPASE 7*   No results for input(s): AMMONIA in the last 168 hours. CBC:  Recent Labs Lab 12/28/13 2149 01/04/14 0233  01/04/14 0302  WBC 11.0* 8.5  --   NEUTROABS 8.8* 6.4  --   HGB 12.8 12.4 12.9  HCT 38.0 36.7 38.0  MCV 96.9 94.6  --   PLT 149* 336  --    Cardiac Enzymes: No results for input(s): CKTOTAL, CKMB, CKMBINDEX, TROPONINI in the last 168 hours.  BNP (last 3 results) No results for input(s): PROBNP in the last 8760 hours. CBG: No results for input(s): GLUCAP in the last 168 hours.  Radiological Exams on Admission: Ct Abdomen Pelvis W Contrast  01/04/2014   CLINICAL DATA:  Left flank pain.  EXAM: CT ABDOMEN AND PELVIS WITH CONTRAST  TECHNIQUE: Multidetector CT imaging of the abdomen and pelvis was performed using the standard protocol following bolus administration of intravenous contrast.  CONTRAST:  100mL OMNIPAQUE IOHEXOL 300 MG/ML  SOLN  COMPARISON:  12/28/2013  FINDINGS: BODY WALL: Unremarkable.  LOWER CHEST: Unremarkable.  ABDOMEN/PELVIS:  Liver: Suspect mild steatosis.  Biliary: No evidence of biliary obstruction or stone.  Pancreas: Unremarkable.  Spleen: Unremarkable.  Adrenals: Unremarkable.  Kidneys and ureters: 2 small stones present within the upper left kidney, nonobstructive. No hydronephrosis.  Bladder: Decompressed. No gas in the urinary bladder to suggest fistula  Reproductive: Hysterectomy.  Probable oophorectomies.  Bowel: Previously noted diverticulitis has progressed, now with a gas and fluid containing abscess which measures 3 by 7 by 4 cm. Fat and peritoneal inflammatory changes in the pelvis are extensive. There is no free pneumoperitoneum. No bowel obstruction. Normal appendix.  Vascular: No acute abnormality.  OSSEOUS: No acute abnormalities.  These results were called by telephone at the time of interpretation on 01/04/2014 at 5:36 am to Dr. Earley Favor , who verbally acknowledged these results.  IMPRESSION: 1. Sigmoid diverticulitis with contained perforation causing 3 x 7 x 4 cm abscess. 2. Left nephrolithiasis.   Electronically Signed   By: Tiburcio Pea M.D.   On:  01/04/2014 05:38       Assessment/Plan:   38 y.o. female with   Principal Problem:   1.   Diverticulitis/ Colonic diverticular abscess/Abdominal pain, LLQ   IV Zosyn   IVFs   Pain Control PRN   Gen Surg Consulted    2.   Asthma   Albuterol Nebs PRN    3.   Kidney calculi   Monitor     4.   DVT Prophylaxis   SCDs      Code Status:      FULL CODE Family Communication:    No Family Present Disposition Plan:     Inpatient Med/Surg Bed    Time spent:  60 MInutes  Ron Parker [facility] Pager [phone]   If 7AM -7PM Please Contact the Day Rounding Team MD for [facility]  If 7PM-7AM, Please Contact Night-Floor Coverage  [email or web address] Password [facility] 01/04/2014, 6:49 AM

## 2014-01-04 NOTE — Consult Note (Signed)
Reason for Consult: Acute sigmoid diverticulitis with abscess Referring Physician: Dr. Lilli FewJenkins  Sonia Bennett is an 38 y.o. female.  HPI: she developed some abdominal pain and diffuse body aching on the 18th of this month. On the 19th of this month she went to the emergency department and a CT scan demonstrated findings consistent with acute diverticulitis. She was placed on oral Cipro and Flagyl. However the pain escalated and she presented again to the emergency department for repeat CT scan demonstrated the worsening diverticulitis as well as a approximately 6 cm abscess. She subsequently was admitted, started on IV antibiotics, and we were asked to see her. She's had some fever as well. Occasional chills. Some pain with urination. She's had loose bowel movements.  Past Medical History  Diagnosis Date  . Asthma   . Kidney calculi   . Diverticulitis     Past Surgical History  Procedure Laterality Date  . Abdominal hysterectomy      No family history on file.  Social History:  reports that she has been smoking Cigarettes.  She has been smoking about 1.00 pack per day. She does not have any smokeless tobacco history on file. She reports that she does not drink alcohol or use illicit drugs.  Allergies:  Allergies  Allergen Reactions  . Vicodin [Hydrocodone-Acetaminophen] Hives    Prior to Admission medications   Medication Sig Start Date End Date Taking? Authorizing Provider  ciprofloxacin (CIPRO) 500 MG tablet Take 1 tablet (500 mg total) by mouth 2 (two) times daily. One po bid x 7 days 12/28/13  Yes Toy CookeyMegan Docherty, MD  metroNIDAZOLE (FLAGYL) 500 MG tablet Take 1 tablet (500 mg total) by mouth 2 (two) times daily. One po bid x 7 days 12/28/13  Yes Toy CookeyMegan Docherty, MD  oxyCODONE-acetaminophen (PERCOCET/ROXICET) 5-325 MG per tablet Take 1-2 tablets by mouth every 6 (six) hours as needed for moderate pain or severe pain. 12/28/13  Yes Toy CookeyMegan Docherty, MD  promethazine (PHENERGAN) 25 MG  tablet Take 25 mg by mouth every 6 (six) hours as needed for nausea or vomiting.   Yes Historical Provider, MD  azithromycin (ZITHROMAX) 250 MG tablet Take 2 tablets daily for 3 days. Patient not taking: Reported on 01/04/2014 08/25/12   Carlisle BeersJohn L Molpus, MD  cyclobenzaprine (FLEXERIL) 10 MG tablet Take 1 tablet (10 mg total) by mouth 3 (three) times daily as needed for muscle spasms. Patient not taking: Reported on 01/04/2014 07/01/12   Geoffery Lyonsouglas Delo, MD  ibuprofen (ADVIL,MOTRIN) 800 MG tablet Take 1 tablet (800 mg total) by mouth 3 (three) times daily. Patient not taking: Reported on 01/04/2014 01/25/12   Elson AreasLeslie K Sofia, PA-C  ondansetron (ZOFRAN) 4 MG tablet Take 1 tablet (4 mg total) by mouth every 6 (six) hours. 12/28/13   Toy CookeyMegan Docherty, MD     Results for orders placed or performed during the hospital encounter of 01/04/14 (from the past 48 hour(s))  CBC with Differential     Status: Abnormal   Collection Time: 01/04/14  2:33 AM  Result Value Ref Range   WBC 8.5 4.0 - 10.5 K/uL   RBC 3.88 3.87 - 5.11 MIL/uL   Hemoglobin 12.4 12.0 - 15.0 g/dL   HCT 16.136.7 09.636.0 - 04.546.0 %   MCV 94.6 78.0 - 100.0 fL   MCH 32.0 26.0 - 34.0 pg   MCHC 33.8 30.0 - 36.0 g/dL   RDW 40.913.2 81.111.5 - 91.415.5 %   Platelets 336 150 - 400 K/uL   Neutrophils Relative % 74  43 - 77 %   Neutro Abs 6.4 1.7 - 7.7 K/uL   Lymphocytes Relative 11 (L) 12 - 46 %   Lymphs Abs 0.9 0.7 - 4.0 K/uL   Monocytes Relative 12 3 - 12 %   Monocytes Absolute 1.0 0.1 - 1.0 K/uL   Eosinophils Relative 3 0 - 5 %   Eosinophils Absolute 0.2 0.0 - 0.7 K/uL   Basophils Relative 0 0 - 1 %   Basophils Absolute 0.0 0.0 - 0.1 K/uL  Urinalysis, Routine w reflex microscopic     Status: Abnormal   Collection Time: 01/04/14  2:43 AM  Result Value Ref Range   Color, Urine AMBER (A) YELLOW    Comment: BIOCHEMICALS MAY BE AFFECTED BY COLOR   APPearance CLEAR CLEAR   Specific Gravity, Urine 1.025 1.005 - 1.030   pH 5.5 5.0 - 8.0   Glucose, UA NEGATIVE  NEGATIVE mg/dL   Hgb urine dipstick LARGE (A) NEGATIVE   Bilirubin Urine NEGATIVE NEGATIVE   Ketones, ur NEGATIVE NEGATIVE mg/dL   Protein, ur 30 (A) NEGATIVE mg/dL   Urobilinogen, UA 0.2 0.0 - 1.0 mg/dL   Nitrite NEGATIVE NEGATIVE   Leukocytes, UA SMALL (A) NEGATIVE  Urine microscopic-add on     Status: None   Collection Time: 01/04/14  2:43 AM  Result Value Ref Range   Squamous Epithelial / LPF RARE RARE   WBC, UA 0-2 <3 WBC/hpf   RBC / HPF 11-20 <3 RBC/hpf   Bacteria, UA RARE RARE   Urine-Other MUCOUS PRESENT   I-stat chem 8, ed     Status: Abnormal   Collection Time: 01/04/14  3:02 AM  Result Value Ref Range   Sodium 137 137 - 147 mEq/L   Potassium 3.5 (L) 3.7 - 5.3 mEq/L   Chloride 98 96 - 112 mEq/L   BUN 4 (L) 6 - 23 mg/dL   Creatinine, Ser 2.950.80 0.50 - 1.10 mg/dL   Glucose, Bld 284116 (H) 70 - 99 mg/dL   Calcium, Ion 1.321.11 (L) 1.12 - 1.23 mmol/L   TCO2 28 0 - 100 mmol/L   Hemoglobin 12.9 12.0 - 15.0 g/dL   HCT 44.038.0 10.236.0 - 72.546.0 %    Ct Abdomen Pelvis W Contrast  01/04/2014   CLINICAL DATA:  Left flank pain.  EXAM: CT ABDOMEN AND PELVIS WITH CONTRAST  TECHNIQUE: Multidetector CT imaging of the abdomen and pelvis was performed using the standard protocol following bolus administration of intravenous contrast.  CONTRAST:  100mL OMNIPAQUE IOHEXOL 300 MG/ML  SOLN  COMPARISON:  12/28/2013  FINDINGS: BODY WALL: Unremarkable.  LOWER CHEST: Unremarkable.  ABDOMEN/PELVIS:  Liver: Suspect mild steatosis.  Biliary: No evidence of biliary obstruction or stone.  Pancreas: Unremarkable.  Spleen: Unremarkable.  Adrenals: Unremarkable.  Kidneys and ureters: 2 small stones present within the upper left kidney, nonobstructive. No hydronephrosis.  Bladder: Decompressed. No gas in the urinary bladder to suggest fistula  Reproductive: Hysterectomy.  Probable oophorectomies.  Bowel: Previously noted diverticulitis has progressed, now with a gas and fluid containing abscess which measures 3 by 7 by 4 cm.  Fat and peritoneal inflammatory changes in the pelvis are extensive. There is no free pneumoperitoneum. No bowel obstruction. Normal appendix.  Vascular: No acute abnormality.  OSSEOUS: No acute abnormalities.  These results were called by telephone at the time of interpretation on 01/04/2014 at 5:36 am to Dr. Earley FavorGAIL SCHULZ , who verbally acknowledged these results.  IMPRESSION: 1. Sigmoid diverticulitis with contained perforation causing 3 x 7 x  4 cm abscess. 2. Left nephrolithiasis.   Electronically Signed   By: Tiburcio Pea M.D.   On: 01/04/2014 05:38    Review of Systems  Constitutional: Positive for fever and chills.  Respiratory: Negative for shortness of breath.   Cardiovascular: Negative for chest pain.  Gastrointestinal: Positive for nausea and abdominal pain. Negative for vomiting and constipation.  Genitourinary: Positive for dysuria.  Musculoskeletal: Positive for back pain.  Endo/Heme/Allergies:       Negative for DVT   Blood pressure 121/66, pulse 73, temperature 98.3 F (36.8 C), temperature source Oral, resp. rate 16, height 5\' 7"  (1.702 m), weight 305 lb 1.9 oz (138.4 kg), SpO2 94 %. Physical Exam  Constitutional: No distress.  Morbidly obese female.  HENT:  Head: Normocephalic and atraumatic.  Eyes: EOM are normal. No scleral icterus.  Cardiovascular: Normal rate and regular rhythm.   Respiratory: Effort normal and breath sounds normal.  GI: Soft. She exhibits no mass. There is tenderness (Moderate in suprapubic and LLQ areas.). There is guarding. There is no rebound.  Musculoskeletal: She exhibits no edema.  Lymphadenopathy:    She has no cervical adenopathy.  Neurological: She is alert.  Skin: Skin is warm and dry.  Psychiatric: She has a normal mood and affect. Her behavior is normal.    Assessment/Plan: Acute sigmoid diverticulitis with 7 cm abscess. Currently on IV Zosyn. No evidence of peritonitis.  Plan: Interventional radiology consultation for  percutaneous drainage. Continue IV antibiotics.  Sonia Bennett J 01/04/2014, 8:12 AM

## 2014-01-04 NOTE — Progress Notes (Signed)
Attempted to call nurse on 3W to give report. Nurse unable to receive report at this time. RN to call ED when ready for report.

## 2014-01-04 NOTE — ED Notes (Signed)
Patient transported to CT 

## 2014-01-04 NOTE — ED Provider Notes (Signed)
CSN: [id number]     Arrival date & time 01/04/14  0137 History   First MD Initiated Contact with Patient 01/04/14 0201     Chief Complaint  Patient presents with  . Flank Pain     (Consider location/radiation/quality/duration/timing/severity/associated sxs/prior Treatment) HPI Comments: This is a morbidly obese 38 year old female who was seen on the 19th, diagnosed with sigmoid diverticular disease as well as renal stones and colic.  She was started on antibiotic and Percocet.  She states she's had no change in her symptoms.  If anything, they are getting worse.  She states when she lays back she has some relief, but when she stands toward.  She has discomfort particularly in the suprapubic area.  She has a history of a total hysterectomy several years ago. Patient states she is having normal bowel movements.  No blood noted.  She is urinating with difficulty.  She states she's having trouble initiating her stream.  She has not noticed any blood in her urine  Patient is a 38 y.o. female presenting with flank pain. The history is provided by the patient.  Flank Pain This is a recurrent problem. The current episode started in the past 7 days. The problem occurs constantly. The problem has been unchanged. Associated symptoms include abdominal pain and nausea. Pertinent negatives include no coughing, fever, neck pain, rash or vomiting. The symptoms are aggravated by exertion. She has tried oral narcotics for the symptoms. The treatment provided no relief.    Past Medical History  Diagnosis Date  . Asthma   . Kidney calculi   . Diverticulitis    Past Surgical History  Procedure Laterality Date  . Abdominal hysterectomy     No family history on file. History  Substance Use Topics  . Smoking status: Current Every Day Smoker -- 1.00 packs/day    Types: Cigarettes  . Smokeless tobacco: Not on file  . Alcohol Use: No   OB History    No data available     Review of Systems    Constitutional: Negative for fever.  Respiratory: Negative for cough and shortness of breath.   Gastrointestinal: Positive for nausea and abdominal pain. Negative for vomiting, diarrhea, constipation and blood in stool.  Genitourinary: Positive for dysuria and flank pain.  Musculoskeletal: Positive for back pain. Negative for neck pain.  Skin: Negative for rash and wound.  All other systems reviewed and are negative.     Allergies  Vicodin  Home Medications   Prior to Admission medications   Medication Sig Start Date End Date Taking? Authorizing Provider  ciprofloxacin (CIPRO) 500 MG tablet Take 1 tablet (500 mg total) by mouth 2 (two) times daily. One po bid x 7 days 12/28/13  Yes Toy CookeyMegan Docherty, MD  metroNIDAZOLE (FLAGYL) 500 MG tablet Take 1 tablet (500 mg total) by mouth 2 (two) times daily. One po bid x 7 days 12/28/13  Yes Toy CookeyMegan Docherty, MD  oxyCODONE-acetaminophen (PERCOCET/ROXICET) 5-325 MG per tablet Take 1-2 tablets by mouth every 6 (six) hours as needed for moderate pain or severe pain. 12/28/13  Yes Toy CookeyMegan Docherty, MD  promethazine (PHENERGAN) 25 MG tablet Take 25 mg by mouth every 6 (six) hours as needed for nausea or vomiting.   Yes Historical Provider, MD  azithromycin (ZITHROMAX) 250 MG tablet Take 2 tablets daily for 3 days. Patient not taking: Reported on 01/04/2014 08/25/12   Carlisle BeersJohn L Molpus, MD  cyclobenzaprine (FLEXERIL) 10 MG tablet Take 1 tablet (10 mg total) by mouth 3 (three)  times daily as needed for muscle spasms. Patient not taking: Reported on 01/04/2014 07/01/12   Geoffery Lyons, MD  ibuprofen (ADVIL,MOTRIN) 800 MG tablet Take 1 tablet (800 mg total) by mouth 3 (three) times daily. Patient not taking: Reported on 01/04/2014 01/25/12   Elson Areas, PA-C  ondansetron (ZOFRAN) 4 MG tablet Take 1 tablet (4 mg total) by mouth every 6 (six) hours. 12/28/13   Toy Cookey, MD   BP 118/44 mmHg  Pulse 76  Temp(Src) 99.3 F (37.4 C)  Resp 16  SpO2  93% Physical Exam  Constitutional: She appears well-developed and well-nourished.  HENT:  Head: Normocephalic.  Eyes: Pupils are equal, round, and reactive to light.  Neck: Normal range of motion.  Cardiovascular: Normal rate.   Pulmonary/Chest: Effort normal.  Abdominal: Soft. Bowel sounds are normal. She exhibits no distension. There is tenderness in the suprapubic area. There is no rebound and no guarding.    Vitals reviewed.   ED Course  Procedures (including critical care time) Labs Review Labs Reviewed  CBC WITH DIFFERENTIAL - Abnormal; Notable for the following:    Lymphocytes Relative 11 (*)    All other components within normal limits  URINALYSIS, ROUTINE W REFLEX MICROSCOPIC - Abnormal; Notable for the following:    Color, Urine AMBER (*)    Hgb urine dipstick LARGE (*)    Protein, ur 30 (*)    Leukocytes, UA SMALL (*)    All other components within normal limits  I-STAT CHEM 8, ED - Abnormal; Notable for the following:    Potassium 3.5 (*)    BUN 4 (*)    Glucose, Bld 116 (*)    Calcium, Ion 1.11 (*)    All other components within normal limits  URINE MICROSCOPIC-ADD ON    Imaging Review Ct Abdomen Pelvis W Contrast  01/04/2014   CLINICAL DATA:  Left flank pain.  EXAM: CT ABDOMEN AND PELVIS WITH CONTRAST  TECHNIQUE: Multidetector CT imaging of the abdomen and pelvis was performed using the standard protocol following bolus administration of intravenous contrast.  CONTRAST:  OMNIPAQUE IOHEXOL 300 MG/ML  SOLN  COMPARISON:  12/28/2013  FINDINGS: BODY WALL: Unremarkable.  LOWER CHEST: Unremarkable.  ABDOMEN/PELVIS:  Liver: Suspect mild steatosis.  Biliary: No evidence of biliary obstruction or stone.  Pancreas: Unremarkable.  Spleen: Unremarkable.  Adrenals: Unremarkable.  Kidneys and ureters: 2 small stones present within the upper left kidney, nonobstructive. No hydronephrosis.  Bladder: Decompressed. No gas in the urinary bladder to suggest fistula   Reproductive: Hysterectomy.  Probable oophorectomies.  Bowel: Previously noted diverticulitis has progressed, now with a gas and fluid containing abscess which measures 3 by 7 by 4 cm. Fat and peritoneal inflammatory changes in the pelvis are extensive. There is no free pneumoperitoneum. No bowel obstruction. Normal appendix.  Vascular: No acute abnormality.  OSSEOUS: No acute abnormalities.  These results were called by telephone at the time of interpretation on 01/04/2014 at 5:36 am to Dr. Earley Favor , who verbally acknowledged these results.  IMPRESSION: 1. Sigmoid diverticulitis with contained perforation causing 3 x 7 x 4 cm abscess. 2. Left nephrolithiasis.   Electronically Signed   By: Tiburcio Pea M.D.   On: 01/04/2014 05:38     EKG Interpretation None     Reviewed CT scan from the 19th as well as labs.  We'll repeat labs and urine catheter urine, but these are resulted.  We'll compare and decide if further imaging required.  Patient states she had a  total hysterectomy including her ovaries Radiology called informing patient has a 7 cm diverticular abscess. I've asked that IV Zosyn be started as an antibiotic.  I've spoken to Dr. Lovell SheehanJenkins hospitalist for admission.  She will be admitted to MedSurg.  I've spoken to the surgeon on call, requesting a consult.  This will be performed later today. MDM   Final diagnoses:  Pain  Intestinal diverticular abscess         Arman FilterGail K Malaka Ruffner, NP 01/04/14 [id number]  Elwin MochaBlair Walden, MD 01/04/14 [phone]

## 2014-01-04 NOTE — Progress Notes (Signed)
ANTIBIOTIC CONSULT NOTE - INITIAL  Pharmacy Consult for:  Zosyn Indication:  Intra-abdominal infection  Allergies  Allergen Reactions  . Vicodin [Hydrocodone-Acetaminophen] Hives    Patient Measurements: Height: 5\' 7"  (170.2 cm) Weight: (!) 305 lb 1.9 oz (138.4 kg) IBW/kg (Calculated) : 61.6   Vital Signs: Temp: 98.3 F (36.8 C) (11/26 0703) Temp Source: Oral (11/26 0703) BP: 121/66 mmHg (11/26 0703) Pulse Rate: 73 (11/26 0703)  Labs:  Recent Labs  01/04/14 0233 01/04/14 0302  WBC 8.5  --   HGB 12.4 12.9  PLT 336  --   CREATININE  --  0.80   Estimated Creatinine Clearance: 137.6 mL/min (by C-G formula based on Cr of 0.8).    Microbiology: No results found for this or any previous visit (from the past 720 hour(s)).  Medical History: Past Medical History  Diagnosis Date  . Asthma   . Kidney calculi   . Diverticulitis     Medications:  Pending  Assessment:  Asked to assist with Zosyn therapy for this 38 year-old female with an intra-abdominal infection.  The medical record [facility] with lower abdominal pain.  CT findings represented acute diverticulitis at the distal sigmoid colon, or colitis. Ciprofloxacin and metronidazole were prescribed x 7 days.  [facility] is due to persistent lower abdominal pain despite oral antibiotics.  Radiology states patient has a 7 cm diverticular abscess.  Goal of Therapy:  Eradication of infection  Plan:  Zosyn 3.375 grams IV every 8 hours, each dose infused over 4 hours.  Polo Rileylaire Cayde Held R.Ph. 01/04/2014 8:02 AM

## 2014-01-04 NOTE — ED Notes (Signed)
Pt with hx of kidney stones reports left flank pain 10/10 and nausea. Pt says she has 4 stones on her left side and reports pain and difficulty with urination.

## 2014-01-04 NOTE — ED Notes (Signed)
Dr. Jenkins at bedside. 

## 2014-01-04 NOTE — Progress Notes (Signed)
38 year old lady admitted for acute diverticulitis with perforation and an abscess. She was started on IV antibiotics and IR consulted for CT guided aspiration. She underwent aspiration without any complications. Currently she is very sleepy. None at bedside. Continue to monitor.  Kathlen ModyVijaya Junella Domke, MD [phone]

## 2014-01-04 NOTE — Plan of Care (Signed)
Problem: Consults Goal: General Surgical Patient Education (See Patient Education module for education specifics) Outcome: Completed/Met Date Met:  01/04/14     

## 2014-01-04 NOTE — ED Notes (Signed)
Pt ambulated to bathroom independently without difficulty.

## 2014-01-04 NOTE — Plan of Care (Signed)
Problem: Phase I Progression Outcomes Goal: Pain controlled with appropriate interventions Outcome: Progressing     

## 2014-01-05 LAB — BASIC METABOLIC PANEL
Anion gap: 13 (ref 5–15)
BUN: 10 mg/dL (ref 6–23)
CO2: 26 mEq/L (ref 19–32)
CREATININE: 0.88 mg/dL (ref 0.50–1.10)
Calcium: 8.9 mg/dL (ref 8.4–10.5)
Chloride: 101 mEq/L (ref 96–112)
GFR calc non Af Amer: 82 mL/min — ABNORMAL LOW (ref >90)
Glucose, Bld: 119 mg/dL — ABNORMAL HIGH (ref 70–99)
Potassium: 3.9 mEq/L (ref 3.7–5.3)
Sodium: 140 mEq/L (ref 137–147)

## 2014-01-05 LAB — CBC
HEMATOCRIT: 38.1 % (ref 36.0–46.0)
Hemoglobin: 12.6 g/dL (ref 12.0–15.0)
MCH: 31.6 pg (ref 26.0–34.0)
MCHC: 33.1 g/dL (ref 30.0–36.0)
MCV: 95.5 fL (ref 78.0–100.0)
Platelets: 342 10*3/uL (ref 150–400)
RBC: 3.99 MIL/uL (ref 3.87–5.11)
RDW: 13.4 % (ref 11.5–15.5)
WBC: 6.6 10*3/uL (ref 4.0–10.5)

## 2014-01-05 MED ORDER — INFLUENZA VAC SPLIT QUAD 0.5 ML IM SUSY
0.5000 mL | PREFILLED_SYRINGE | INTRAMUSCULAR | Status: AC
  Administered 2014-01-06: 0.5 mL via INTRAMUSCULAR
  Filled 2014-01-05 (×2): qty 0.5

## 2014-01-05 MED ORDER — PNEUMOCOCCAL VAC POLYVALENT 25 MCG/0.5ML IJ INJ
0.5000 mL | INJECTION | INTRAMUSCULAR | Status: AC
  Administered 2014-01-06: 0.5 mL via INTRAMUSCULAR
  Filled 2014-01-05 (×2): qty 0.5

## 2014-01-05 NOTE — Progress Notes (Signed)
Subjective: Less abdominal pain.  Objective: Vital signs in last 24 hours: Temp:  [97.6 F (36.4 C)-98.4 F (36.9 C)] 97.6 F (36.4 C) (11/27 0522) Pulse Rate:  [73-106] 73 (11/27 0522) Resp:  [12-28] 18 (11/27 0522) BP: (95-116)/(57-73) 110/62 mmHg (11/27 0522) SpO2:  [90 %-100 %] 93 % (11/27 0522) Last BM Date: 01/03/14  Intake/Output from previous day: 11/26 0701 - 11/27 0700 In: 1665 [I.V.:1565; IV Piggyback:100] Out: -  Intake/Output this shift:    PE: General- In NAD Abdomen-soft, not tender this AM  Lab Results:   Recent Labs  01/04/14 0233 01/04/14 0302 01/05/14 0517  WBC 8.5  --  6.6  HGB 12.4 12.9 12.6  HCT 36.7 38.0 38.1  PLT 336  --  342   BMET  Recent Labs  01/04/14 0302 01/05/14 0517  NA 137 140  K 3.5* 3.9  CL 98 101  CO2  --  26  GLUCOSE 116* 119*  BUN 4* 10  CREATININE 0.80 0.88  CALCIUM  --  8.9   PT/INR No results for input(s): LABPROT, INR in the last 72 hours. Comprehensive Metabolic Panel:    Component Value Date/Time   NA 140 01/05/2014 0517   NA 137 01/04/2014 0302   K 3.9 01/05/2014 0517   K 3.5* 01/04/2014 0302   CL 101 01/05/2014 0517   CL 98 01/04/2014 0302   CO2 26 01/05/2014 0517   CO2 21 12/28/2013 2149   BUN 10 01/05/2014 0517   BUN 4* 01/04/2014 0302   CREATININE 0.88 01/05/2014 0517   CREATININE 0.80 01/04/2014 0302   GLUCOSE 119* 01/05/2014 0517   GLUCOSE 116* 01/04/2014 0302   CALCIUM 8.9 01/05/2014 0517   CALCIUM 8.8 12/28/2013 2149   AST 10 12/28/2013 2149   ALT 7 12/28/2013 2149   ALKPHOS 74 12/28/2013 2149   BILITOT 0.5 12/28/2013 2149   PROT 6.7 12/28/2013 2149   ALBUMIN 3.1* 12/28/2013 2149     Studies/Results: Ct Abdomen Pelvis W Contrast  01/04/2014   CLINICAL DATA:  Left flank pain.  EXAM: CT ABDOMEN AND PELVIS WITH CONTRAST  TECHNIQUE: Multidetector CT imaging of the abdomen and pelvis was performed using the standard protocol following bolus administration of intravenous contrast.   CONTRAST:  100mL OMNIPAQUE IOHEXOL 300 MG/ML  SOLN  COMPARISON:  12/28/2013  FINDINGS: BODY WALL: Unremarkable.  LOWER CHEST: Unremarkable.  ABDOMEN/PELVIS:  Liver: Suspect mild steatosis.  Biliary: No evidence of biliary obstruction or stone.  Pancreas: Unremarkable.  Spleen: Unremarkable.  Adrenals: Unremarkable.  Kidneys and ureters: 2 small stones present within the upper left kidney, nonobstructive. No hydronephrosis.  Bladder: Decompressed. No gas in the urinary bladder to suggest fistula  Reproductive: Hysterectomy.  Probable oophorectomies.  Bowel: Previously noted diverticulitis has progressed, now with a gas and fluid containing abscess which measures 3 by 7 by 4 cm. Fat and peritoneal inflammatory changes in the pelvis are extensive. There is no free pneumoperitoneum. No bowel obstruction. Normal appendix.  Vascular: No acute abnormality.  OSSEOUS: No acute abnormalities.  These results were called by telephone at the time of interpretation on 01/04/2014 at 5:36 am to Dr. Earley FavorGAIL SCHULZ , who verbally acknowledged these results.  IMPRESSION: 1. Sigmoid diverticulitis with contained perforation causing 3 x 7 x 4 cm abscess. 2. Left nephrolithiasis.   Electronically Signed   By: Tiburcio PeaJonathan  Watts M.D.   On: 01/04/2014 05:38   Ct Aspiration  01/04/2014   CLINICAL DATA:  Pelvic diverticular abscess  EXAM: CT GUIDED DRAINAGE OF  PELVIC DIVERTICULAR ABSCESS  ANESTHESIA/SEDATION: Four Mg IV Versed 175 mcg IV Fentanyl  Total Moderate Sedation Time:  25 minutes  PROCEDURE: The procedure, risks, benefits, and alternatives were explained to the patient. Questions regarding the procedure were encouraged and answered. The patient understands and consents to the procedure.  The right trans gluteal area was prepped with Betadinein a sterile fashion, and a sterile drape was applied covering the operative field. A sterile gown and sterile gloves were used for the procedure. Local anesthesia was provided with 1% Lidocaine.   Previous imaging reviewed. Patient positioned prone. Noncontrast localization CT performed through the pelvis. The small pericolonic diverticular abscess was localized. From a right trans gluteal approach, a 20 cm 18 gauge access needle was advanced percutaneously under CT guidance into the small abscess. Needle position traverses very close to the colon wall on the right side. Needle position is confirmed within the abscess cavity. Syringe aspiration yielded 35 cc purulent fluid. Sample sent for Gram stain and culture. Because of the percutaneous needle access close to the colonic wall on the right side and the depth of the abscess, drain catheter was not placed.(Risk of adjacent bowel injury). The abscess was nearly completely collapsed by Needle aspiration.  COMPLICATIONS: None immediate  FINDINGS: Imaging confirms needle placement in the diverticular pelvic abscess for drainage  IMPRESSION: Successful CT-guided pelvic diverticular abscess needle aspiration for drainage. Gram stain and culture sent.   Electronically Signed   By: Ruel Favorsrevor  Shick M.D.   On: 01/04/2014 13:12    Anti-infectives: Anti-infectives    Start     Dose/Rate Route Frequency Ordered Stop   01/04/14 1400  piperacillin-tazobactam (ZOSYN) IVPB 3.375 g     3.375 g12.5 mL/hr over 240 Minutes Intravenous 3 times per day 01/04/14 0732     01/04/14 0545  piperacillin-tazobactam (ZOSYN) IVPB 3.375 g     3.375 g12.5 mL/hr over 240 Minutes Intravenous  Once 01/04/14 0537 01/04/14 0949      Assessment Principal Problem:   Sigmoid Diverticulitis with abscess s/p percutaneous aspiration-clinically improved.     LOS: 1 day   Plan: Continue IV abxs, clear liquids.   Knut Rondinelli J 01/05/2014

## 2014-01-05 NOTE — Progress Notes (Signed)
Pt has coating on her tongue. States feels odd. Dr. Blake DivineAkula texted regarding her tongue.

## 2014-01-05 NOTE — Progress Notes (Signed)
[facility] PROGRESS NOTE  Sonia Sonia Bennett ZOX:[id number] DOB: [birth year] DOA: 01/04/2014 PCP: No primary care provider on file. Interim summary Sonia Bennett is a 38 y.o. female with a history of Asthma and Depression who presents to the ED with complaints of LLQ and lower ABD Pain x 1 week. She had been seen in the ED on 11/19 and found to have a UTI and Rrenal stones and Diverticulitis and was placed on Bactrim RX but she returned tonight due to continued pain and nausea. A CT scan of the ABD was performed and revealed a Diverticular Abscess and she was placed on IV Zosyn and referred for medical admission. General Surgery was consulted Assessment/Plan: 1. Diverticulitis with abscess formation s/p CT guided aspiration of the abscess. She is clinically improving on IV fluids and IV antibiotics. Surgery consulted and recommendations given.   2. Hypokalemia Repleted as needed.     Code Status: full code.  Family Communication: family at bedside Disposition Plan: pending.    Consultants:  Surgery   Procedures:  CT guided aspiration of the abscess.   Antibiotics:  IV zosyn  HPI/Subjective: Pain is much improved. She is cheerful.   Objective: Filed Vitals:   01/05/14 0940  BP: 111/75  Pulse: 88  Temp: 97.8 F (36.6 C)  Resp: 20    Intake/Output Summary (Last 24 hours) at 01/05/14 1238 Last data filed at 01/05/14 0900  Gross per 24 hour  Intake   1665 ml  Output      0 ml  Net   1665 ml   Filed Weights   01/04/14 0703  Weight: 138.4 kg (305 lb 1.9 oz)    Exam:   General:  Alert afebrile comfortable  Cardiovascular: s1s2  Respiratory: ctab  Abdomen: soft mild left lower quadrant tenderness. Non distended. Bowel sounds heard.   Musculoskeletal: no pedal edema.   Data Reviewed: Basic Metabolic Panel:  Recent Labs Lab 01/04/14 0302 01/05/14 0517  NA 137 140  K 3.5* 3.9  CL 98 101  CO2  --  26  GLUCOSE 116* 119*  BUN 4* 10  CREATININE  0.80 0.88  CALCIUM  --  8.9   Liver Function Tests: No results for input(s): AST, ALT, ALKPHOS, BILITOT, PROT, ALBUMIN in the last 168 hours. No results for input(s): LIPASE, AMYLASE in the last 168 hours. No results for input(s): AMMONIA in the last 168 hours. CBC:  Recent Labs Lab 01/04/14 0233 01/04/14 0302 01/05/14 0517  WBC 8.5  --  6.6  NEUTROABS 6.4  --   --   HGB 12.4 12.9 12.6  HCT 36.7 38.0 38.1  MCV 94.6  --  95.5  PLT 336  --  342   Cardiac Enzymes: No results for input(s): CKTOTAL, CKMB, CKMBINDEX, TROPONINI in the last 168 hours. BNP (last 3 results) No results for input(s): PROBNP in the last 8760 hours. CBG: No results for input(s): GLUCAP in the last 168 hours.  Recent Results (from the past 240 hour(s))  Culture, routine-abscess     Status: None (Preliminary result)   Collection Time: 01/04/14  8:52 AM  Result Value Ref Range Status   Specimen Description ABSCESS  Final   Special Requests Normal  Final   Gram Stain   Final    ABUNDANT WBC PRESENT,BOTH PMN AND MONONUCLEAR NO SQUAMOUS EPITHELIAL CELLS SEEN ABUNDANT GRAM POSITIVE COCCI IN PAIRS IN CHAINS IN CLUSTERS MODERATE GRAM NEGATIVE RODS Performed at [facility]    Culture   Final  Culture reincubated for better growth Performed at [facility]    Report Status PENDING  Incomplete     Studies: Ct Abdomen Pelvis W Contrast  01/04/2014   CLINICAL DATA:  Left flank pain.  EXAM: CT ABDOMEN AND PELVIS WITH CONTRAST  TECHNIQUE: Multidetector CT imaging of the abdomen and pelvis was performed using the standard protocol following bolus administration of intravenous contrast.  CONTRAST:  100mL OMNIPAQUE IOHEXOL 300 MG/ML  SOLN  COMPARISON:  12/28/2013  FINDINGS: BODY WALL: Unremarkable.  LOWER CHEST: Unremarkable.  ABDOMEN/PELVIS:  Liver: Suspect mild steatosis.  Biliary: No evidence of biliary obstruction or stone.  Pancreas: Unremarkable.  Spleen: Unremarkable.  Adrenals:  Unremarkable.  Kidneys and ureters: 2 small stones present within the upper left kidney, nonobstructive. No hydronephrosis.  Bladder: Decompressed. No gas in the urinary bladder to suggest fistula  Reproductive: Hysterectomy.  Probable oophorectomies.  Bowel: Previously noted diverticulitis has progressed, now with a gas and fluid containing abscess which measures 3 by 7 by 4 cm. Fat and peritoneal inflammatory changes in the pelvis are extensive. There is no free pneumoperitoneum. No bowel obstruction. Normal appendix.  Vascular: No acute abnormality.  OSSEOUS: No acute abnormalities.  These results were called by telephone at the time of interpretation on 01/04/2014 at 5:36 am to Dr. Earley FavorGAIL SCHULZ , who verbally acknowledged these results.  IMPRESSION: 1. Sigmoid diverticulitis with contained perforation causing 3 x 7 x 4 cm abscess. 2. Left nephrolithiasis.   Electronically Signed   By: Tiburcio PeaJonathan  Watts M.D.   On: 01/04/2014 05:38   Ct Aspiration  01/04/2014   CLINICAL DATA:  Pelvic diverticular abscess  EXAM: CT GUIDED DRAINAGE OF PELVIC DIVERTICULAR ABSCESS  ANESTHESIA/SEDATION: Four Mg IV Versed 175 mcg IV Fentanyl  Total Moderate Sedation Time:  25 minutes  PROCEDURE: The procedure, risks, benefits, and alternatives were explained to the patient. Questions regarding the procedure were encouraged and answered. The patient understands and consents to the procedure.  The right trans gluteal area was prepped with Betadinein a sterile fashion, and a sterile drape was applied covering the operative field. A sterile gown and sterile gloves were used for the procedure. Local anesthesia was provided with 1% Lidocaine.  Previous imaging reviewed. Patient positioned prone. Noncontrast localization CT performed through the pelvis. The small pericolonic diverticular abscess was localized. From a right trans gluteal approach, a 20 cm 18 gauge access needle was advanced percutaneously under CT guidance into the small  abscess. Needle position traverses very close to the colon wall on the right side. Needle position is confirmed within the abscess cavity. Syringe aspiration yielded 35 cc purulent fluid. Sample sent for Gram stain and culture. Because of the percutaneous needle access close to the colonic wall on the right side and the depth of the abscess, drain catheter was not placed.(Risk of adjacent bowel injury). The abscess was nearly completely collapsed by Needle aspiration.  COMPLICATIONS: None immediate  FINDINGS: Imaging confirms needle placement in the diverticular pelvic abscess for drainage  IMPRESSION: Successful CT-guided pelvic diverticular abscess needle aspiration for drainage. Gram stain and culture sent.   Electronically Signed   By: Ruel Favorsrevor  Shick M.D.   On: 01/04/2014 13:12    Scheduled Meds: . piperacillin-tazobactam (ZOSYN)  IV  3.375 g Intravenous 3 times per day   Continuous Infusions: . sodium chloride 100 mL/hr at 01/05/14 1217    Principal Problem:   Diverticulitis Active Problems:   Colonic diverticular abscess   Asthma   Kidney calculi   Abdominal  pain, LLQ    Time spent: 25 minutes.     [facility]  [facility] Pager [phone] If 7PM-7AM, please contact night-coverage at [email or web address], password [email or web address] 01/05/2014, 12:38 PM  LOS: 1 day

## 2014-01-06 LAB — HEMOGLOBIN AND HEMATOCRIT, BLOOD
HCT: 40.2 % (ref 36.0–46.0)
HEMOGLOBIN: 13.4 g/dL (ref 12.0–15.0)

## 2014-01-06 MED ORDER — RISAQUAD PO CAPS
1.0000 | ORAL_CAPSULE | Freq: Every day | ORAL | Status: DC
  Administered 2014-01-06 – 2014-01-09 (×4): 1 via ORAL
  Filled 2014-01-06 (×4): qty 1

## 2014-01-06 NOTE — Progress Notes (Signed)
Subjective: Back and lower abdominal pain continue to decrease.  Objective: Vital signs in last 24 hours: Temp:  [97.4 F (36.3 C)-98.2 F (36.8 C)] 98 F (36.7 C) (11/28 0222) Pulse Rate:  [60-100] 100 (11/28 0222) Resp:  [16-20] 16 (11/28 0222) BP: (108-116)/(50-75) 116/55 mmHg (11/28 0222) SpO2:  [93 %-98 %] 93 % (11/28 0222) Weight:  [301 lb 6.4 oz (136.714 kg)] 301 lb 6.4 oz (136.714 kg) (11/27 2053) Last BM Date: 01/05/14  Intake/Output from previous day: 11/27 0701 - 11/28 0700 In: 600 [P.O.:300; I.V.:300] Out: -  Intake/Output this shift:    PE: General- In NAD Abdomen-soft, not tender   Lab Results:   Recent Labs  01/04/14 0233 01/04/14 0302 01/05/14 0517  WBC 8.5  --  6.6  HGB 12.4 12.9 12.6  HCT 36.7 38.0 38.1  PLT 336  --  342   BMET  Recent Labs  01/04/14 0302 01/05/14 0517  NA 137 140  K 3.5* 3.9  CL 98 101  CO2  --  26  GLUCOSE 116* 119*  BUN 4* 10  CREATININE 0.80 0.88  CALCIUM  --  8.9   PT/INR No results for input(s): LABPROT, INR in the last 72 hours. Comprehensive Metabolic Panel:    Component Value Date/Time   NA 140 01/05/2014 0517   NA 137 01/04/2014 0302   K 3.9 01/05/2014 0517   K 3.5* 01/04/2014 0302   CL 101 01/05/2014 0517   CL 98 01/04/2014 0302   CO2 26 01/05/2014 0517   CO2 21 12/28/2013 2149   BUN 10 01/05/2014 0517   BUN 4* 01/04/2014 0302   CREATININE 0.88 01/05/2014 0517   CREATININE 0.80 01/04/2014 0302   GLUCOSE 119* 01/05/2014 0517   GLUCOSE 116* 01/04/2014 0302   CALCIUM 8.9 01/05/2014 0517   CALCIUM 8.8 12/28/2013 2149   AST 10 12/28/2013 2149   ALT 7 12/28/2013 2149   ALKPHOS 74 12/28/2013 2149   BILITOT 0.5 12/28/2013 2149   PROT 6.7 12/28/2013 2149   ALBUMIN 3.1* 12/28/2013 2149     Studies/Results: Ct Aspiration  01/04/2014   CLINICAL DATA:  Pelvic diverticular abscess  EXAM: CT GUIDED DRAINAGE OF PELVIC DIVERTICULAR ABSCESS  ANESTHESIA/SEDATION: Four Mg IV Versed 175 mcg IV Fentanyl   Total Moderate Sedation Time:  25 minutes  PROCEDURE: The procedure, risks, benefits, and alternatives were explained to the patient. Questions regarding the procedure were encouraged and answered. The patient understands and consents to the procedure.  The right trans gluteal area was prepped with Betadinein a sterile fashion, and a sterile drape was applied covering the operative field. A sterile gown and sterile gloves were used for the procedure. Local anesthesia was provided with 1% Lidocaine.  Previous imaging reviewed. Patient positioned prone. Noncontrast localization CT performed through the pelvis. The small pericolonic diverticular abscess was localized. From a right trans gluteal approach, a 20 cm 18 gauge access needle was advanced percutaneously under CT guidance into the small abscess. Needle position traverses very close to the colon wall on the right side. Needle position is confirmed within the abscess cavity. Syringe aspiration yielded 35 cc purulent fluid. Sample sent for Gram stain and culture. Because of the percutaneous needle access close to the colonic wall on the right side and the depth of the abscess, drain catheter was not placed.(Risk of adjacent bowel injury). The abscess was nearly completely collapsed by Needle aspiration.  COMPLICATIONS: None immediate  FINDINGS: Imaging confirms needle placement in the diverticular pelvic abscess for  drainage  IMPRESSION: Successful CT-guided pelvic diverticular abscess needle aspiration for drainage. Gram stain and culture sent.   Electronically Signed   By: Ruel Favorsrevor  Shick M.D.   On: 01/04/2014 13:12    Anti-infectives: Anti-infectives    Start     Dose/Rate Route Frequency Ordered Stop   01/04/14 1400  piperacillin-tazobactam (ZOSYN) IVPB 3.375 g     3.375 g12.5 mL/hr over 240 Minutes Intravenous 3 times per day 01/04/14 0732     01/04/14 0545  piperacillin-tazobactam (ZOSYN) IVPB 3.375 g     3.375 g12.5 mL/hr over 240 Minutes  Intravenous  Once 01/04/14 0537 01/04/14 0949      Assessment Principal Problem:   Sigmoid Diverticulitis with abscess s/p percutaneous aspiration-continued clinical improvement.     LOS: 2 days   Plan: Continue IV abxs.  Add probiotic.  Advance to full liquid diet.   Sonia Bennett 01/06/2014

## 2014-01-06 NOTE — Progress Notes (Signed)
[facility] PROGRESS NOTE  Sonia Bennett QIO:[id number] DOB: 1975/05/28 DOA: 01/04/2014 PCP: No primary care provider on file. Interim summary Sonia Bennett is a 38 y.o. female with a history of Asthma and Depression who presents to the ED with complaints of LLQ and lower ABD Pain x 1 week. She had been seen in the ED on 11/19 and found to have a UTI and Rrenal stones and Diverticulitis and was placed on Bactrim RX but she returned tonight due to continued pain and nausea. A CT scan of the ABD was performed and revealed a Diverticular Abscess and she was placed on IV Zosyn and referred for medical admission. General Surgery was consulted Assessment/Plan: 1. Diverticulitis with abscess formation s/p CT guided aspiration of the abscess. She is clinically improving on IV fluids and IV antibiotics. Surgery consulted and recommendations given. startedher on clear liquid diet and she was able to tolerate without nasuea or vomiting. Her abdominal pain is improving and she reports one bloody bowel movement.H&H ordered. Check CBC in am.   2. Hypokalemia Repleted as needed.     Code Status: full code.  Family Communication: family at bedside Disposition Plan: pending.    Consultants:  Surgery   Procedures:  CT guided aspiration of the abscess.   Antibiotics:  IV zosyn  HPI/Subjective: Pain is much improved. She is cheerful. One bloody BM today.   Objective: Filed Vitals:   01/06/14 1000  BP: 106/64  Pulse: 67  Temp: 98 F (36.7 C)  Resp: 18    Intake/Output Summary (Last 24 hours) at 01/06/14 1234 Last data filed at 01/06/14 0900  Gross per 24 hour  Intake    960 ml  Output      0 ml  Net    960 ml   Filed Weights   01/04/14 0703 01/05/14 2053  Weight: 138.4 kg (305 lb 1.9 oz) 136.714 kg (301 lb 6.4 oz)    Exam:   General:  Alert afebrile comfortable  Cardiovascular: s1s2  Respiratory: ctab  Abdomen: soft mild left lower quadrant tenderness. Non  distended. Bowel sounds heard.   Musculoskeletal: no pedal edema.   Data Reviewed: Basic Metabolic Panel:  Recent Labs Lab 01/04/14 0302 01/05/14 0517  NA 137 140  K 3.5* 3.9  CL 98 101  CO2  --  26  GLUCOSE 116* 119*  BUN 4* 10  CREATININE 0.80 0.88  CALCIUM  --  8.9   Liver Function Tests: No results for input(s): AST, ALT, ALKPHOS, BILITOT, PROT, ALBUMIN in the last 168 hours. No results for input(s): LIPASE, AMYLASE in the last 168 hours. No results for input(s): AMMONIA in the last 168 hours. CBC:  Recent Labs Lab 01/04/14 0233 01/04/14 0302 01/05/14 0517  WBC 8.5  --  6.6  NEUTROABS 6.4  --   --   HGB 12.4 12.9 12.6  HCT 36.7 38.0 38.1  MCV 94.6  --  95.5  PLT 336  --  342   Cardiac Enzymes: No results for input(s): CKTOTAL, CKMB, CKMBINDEX, TROPONINI in the last 168 hours. BNP (last 3 results) No results for input(s): PROBNP in the last 8760 hours. CBG: No results for input(s): GLUCAP in the last 168 hours.  Recent Results (from the past 240 hour(s))  Culture, routine-abscess     Status: None (Preliminary result)   Collection Time: 01/04/14  8:52 AM  Result Value Ref Range Status   Specimen Description ABSCESS  Final   Special Requests Normal  Final   Gram  Stain   Final    ABUNDANT WBC PRESENT,BOTH PMN AND MONONUCLEAR NO SQUAMOUS EPITHELIAL CELLS SEEN ABUNDANT GRAM POSITIVE COCCI IN PAIRS IN CHAINS IN CLUSTERS MODERATE GRAM NEGATIVE RODS Performed at [facility]    Culture   Final    MULTIPLE ORGANISMS PRESENT, NONE PREDOMINANT Performed at [facility]    Report Status PENDING  Incomplete     Studies: Ct Aspiration  01/04/2014   CLINICAL DATA:  Pelvic diverticular abscess  EXAM: CT GUIDED DRAINAGE OF PELVIC DIVERTICULAR ABSCESS  ANESTHESIA/SEDATION: Four Mg IV Versed 175 mcg IV Fentanyl  Total Moderate Sedation Time:  25 minutes  PROCEDURE: The procedure, risks, benefits, and alternatives were explained to the patient.  Questions regarding the procedure were encouraged and answered. The patient understands and consents to the procedure.  The right trans gluteal area was prepped with Betadinein a sterile fashion, and a sterile drape was applied covering the operative field. A sterile gown and sterile gloves were used for the procedure. Local anesthesia was provided with 1% Lidocaine.  Previous imaging reviewed. Patient positioned prone. Noncontrast localization CT performed through the pelvis. The small pericolonic diverticular abscess was localized. From a right trans gluteal approach, a 20 cm 18 gauge access needle was advanced percutaneously under CT guidance into the small abscess. Needle position traverses very close to the colon wall on the right side. Needle position is confirmed within the abscess cavity. Syringe aspiration yielded 35 cc purulent fluid. Sample sent for Gram stain and culture. Because of the percutaneous needle access close to the colonic wall on the right side and the depth of the abscess, drain catheter was not placed.(Risk of adjacent bowel injury). The abscess was nearly completely collapsed by Needle aspiration.  COMPLICATIONS: None immediate  FINDINGS: Imaging confirms needle placement in the diverticular pelvic abscess for drainage  IMPRESSION: Successful CT-guided pelvic diverticular abscess needle aspiration for drainage. Gram stain and culture sent.   Electronically Signed   By: Ruel Favorsrevor  Shick M.D.   On: 01/04/2014 13:12    Scheduled Meds: . acidophilus  1 capsule Oral Daily  . piperacillin-tazobactam (ZOSYN)  IV  3.375 g Intravenous 3 times per day   Continuous Infusions: . sodium chloride 50 mL/hr at 01/06/14 1111    Principal Problem:   Diverticulitis Active Problems:   Colonic diverticular abscess   Asthma   Kidney calculi   Abdominal pain, LLQ    Time spent: 15 minutes.     [facility] LLCKULA,Hridaan Bouse  [facility] Pager [phone] If 7PM-7AM, please contact night-coverage at  [email or web address], password [facility] 01/06/2014, 12:34 PM  LOS: 2 days

## 2014-01-06 NOTE — Plan of Care (Signed)
Problem: Phase I Progression Outcomes Goal: Pain controlled with appropriate interventions Outcome: Completed/Met Date Met:  01/06/14 Goal: OOB as tolerated unless otherwise ordered Outcome: Completed/Met Date Met:  01/06/14 Goal: Incision/dressings dry and intact Outcome: Completed/Met Date Met:  01/06/14 Goal: Sutures/staples intact Outcome: Not Applicable Date Met:  20/35/59 Goal: Tubes/drains patent Outcome: Not Applicable Date Met:  74/16/38 Goal: Initial discharge plan identified Outcome: Completed/Met Date Met:  01/06/14 Goal: Voiding-avoid urinary catheter unless indicated Outcome: Completed/Met Date Met:  01/06/14 Goal: Vital signs/hemodynamically stable Outcome: Completed/Met Date Met:  01/06/14 Goal: Other Phase I Outcomes/Goals Outcome: Completed/Met Date Met:  01/06/14

## 2014-01-06 NOTE — Plan of Care (Signed)
Problem: Consults Goal: Skin Care Protocol Initiated - if Braden Score 18 or less If consults are not indicated, leave blank or document N/A  Outcome: Not Applicable Date Met:  01/06/14 Goal: Nutrition Consult-if indicated Outcome: Not Applicable Date Met:  01/06/14 Goal: Diabetes Guidelines if Diabetic/Glucose > 140 If diabetic or lab glucose is > 140 mg/dl - Initiate Diabetes/Hyperglycemia Guidelines & Document Interventions  Outcome: Not Applicable Date Met:  01/06/14  Problem: Phase II Progression Outcomes Goal: Progress activity as tolerated unless otherwise ordered Outcome: Completed/Met Date Met:  01/06/14 Goal: Vital signs stable Outcome: Completed/Met Date Met:  01/06/14 Goal: Surgical site without signs of infection Outcome: Completed/Met Date Met:  01/06/14 Goal: Sutures/staples intact Outcome: Not Applicable Date Met:  01/06/14 Goal: Foley discontinued Outcome: Not Applicable Date Met:  01/06/14     

## 2014-01-07 LAB — CBC
HEMATOCRIT: 34.3 % — AB (ref 36.0–46.0)
HEMOGLOBIN: 11.7 g/dL — AB (ref 12.0–15.0)
MCH: 32.1 pg (ref 26.0–34.0)
MCHC: 34.1 g/dL (ref 30.0–36.0)
MCV: 94 fL (ref 78.0–100.0)
Platelets: 389 10*3/uL (ref 150–400)
RBC: 3.65 MIL/uL — ABNORMAL LOW (ref 3.87–5.11)
RDW: 13.3 % (ref 11.5–15.5)
WBC: 7.3 10*3/uL (ref 4.0–10.5)

## 2014-01-07 LAB — CULTURE, ROUTINE-ABSCESS: SPECIAL REQUESTS: NORMAL

## 2014-01-07 NOTE — Progress Notes (Addendum)
[facility] PROGRESS NOTE  Sonia Bennett [id number] DOB: [birth year] DOA: 01/04/2014 PCP: No primary care provider on file. Interim summary Sonia Bennett is a 38 y.o. female with a history of Asthma and Depression who presents to the ED with complaints of LLQ and lower ABD Pain x 1 week. She had been seen in the ED on 11/19 and found to have a UTI and Rrenal stones and Diverticulitis and was placed on Bactrim RX but she returned tonight due to continued pain and nausea. A CT scan of the ABD was performed and revealed a Diverticular Abscess and she was placed on IV Zosyn and referred for medical admission. General Surgery was consulted Assessment/Plan: Diverticulitis with abscess formation s/p CT guided aspiration of the abscess. She is clinically improving on IV fluids and IV antibiotics. Surgery consulted and recommendations given. startedher on clear liquid diet and she was able to tolerate without nasuea or vomiting. Her diet was advanced and  Her abdominal pain is improving . No bloody bowel movements.  2. Hypokalemia Repleted as needed.     Code Status: full code.  Family Communication: family at bedside Disposition Plan: pending.    Consultants:  Surgery   Procedures:  CT guided aspiration of the abscess.   Antibiotics:  IV zosyn  HPI/Subjective: No bloody bowel movements. On full liquiddiet.   Objective: Filed Vitals:   01/07/14 1730  BP: 126/80  Pulse: 65  Temp: 97.5 F (36.4 C)  Resp: 18    Intake/Output Summary (Last 24 hours) at 01/07/14 2037 Last data filed at 01/07/14 1843  Gross per 24 hour  Intake   2470 ml  Output      0 ml  Net   2470 ml   Filed Weights   01/04/14 0703 01/05/14 2053  Weight: 138.4 kg (305 lb 1.9 oz) 136.714 kg (301 lb 6.4 oz)    Exam:   General:  Alert afebrile comfortable  Cardiovascular: s1s2  Respiratory: ctab  Abdomen: soft mild left lower quadrant tenderness. Non distended. Bowel sounds heard.    Musculoskeletal: no pedal edema.   Data Reviewed: Basic Metabolic Panel:  Recent Labs Lab 01/04/14 0302 01/05/14 0517  NA 137 140  K 3.5* 3.9  CL 98 101  CO2  --  26  GLUCOSE 116* 119*  BUN 4* 10  CREATININE 0.80 0.88  CALCIUM  --  8.9   Liver Function Tests: No results for input(s): AST, ALT, ALKPHOS, BILITOT, PROT, ALBUMIN in the last 168 hours. No results for input(s): LIPASE, AMYLASE in the last 168 hours. No results for input(s): AMMONIA in the last 168 hours. CBC:  Recent Labs Lab 01/04/14 0233 01/04/14 0302 01/05/14 0517 01/06/14 1300 01/07/14 0538  WBC 8.5  --  6.6  --  7.3  NEUTROABS 6.4  --   --   --   --   HGB 12.4 12.9 12.6 13.4 11.7*  HCT 36.7 38.0 38.1 40.2 34.3*  MCV 94.6  --  95.5  --  94.0  PLT 336  --  342  --  389   Cardiac Enzymes: No results for input(s): CKTOTAL, CKMB, CKMBINDEX, TROPONINI in the last 168 hours. BNP (last 3 results) No results for input(s): PROBNP in the last 8760 hours. CBG: No results for input(s): GLUCAP in the last 168 hours.  Recent Results (from the past 240 hour(s))  Culture, routine-abscess     Status: None   Collection Time: 01/04/14  8:52 AM  Result Value Ref Range Status  Specimen Description ABSCESS  Final   Special Requests Normal  Final   Gram Stain   Final    ABUNDANT WBC PRESENT,BOTH PMN AND MONONUCLEAR NO SQUAMOUS EPITHELIAL CELLS SEEN ABUNDANT GRAM POSITIVE COCCI IN PAIRS IN CHAINS IN CLUSTERS MODERATE GRAM NEGATIVE RODS Performed at [facility]    Culture   Final    MULTIPLE ORGANISMS PRESENT, NONE PREDOMINANT Note: NO STAPHYLOCOCCUS AUREUS ISOLATED NO GROUP A STREP (S.PYOGENES) ISOLATED Performed at [facility]    Report Status 01/07/2014 FINAL  Final     Studies: No results found.  Scheduled Meds: . acidophilus  1 capsule Oral Daily  . piperacillin-tazobactam (ZOSYN)  IV  3.375 g Intravenous 3 times per day   Continuous Infusions: . sodium chloride 50 mL/hr at  01/07/14 [id number]    Principal Problem:   Diverticulitis Active Problems:   Colonic diverticular abscess   Asthma   Kidney calculi   Abdominal pain, LLQ    Time spent: 15 minutes.     [facility] OrtizKULA,Anaja Monts  [facility] Pager [phone] If 7PM-7AM, please contact night-coverage at [email or web address], password [email or web address] 01/07/2014, 8:37 PM  LOS: 3 days

## 2014-01-07 NOTE — Progress Notes (Signed)
  Subjective: Back pain persists.  Some nausea this AM after having a BM.  Objective: Vital signs in last 24 hours: Temp:  [98 F (36.7 C)-99 F (37.2 C)] 98.4 F (36.9 C) (11/29 0531) Pulse Rate:  [67-77] 75 (11/29 0531) Resp:  [16-18] 16 (11/29 0531) BP: (100-126)/(53-66) 107/53 mmHg (11/29 0531) SpO2:  [95 %-98 %] 98 % (11/29 0531) Last BM Date: 01/05/14  Intake/Output from previous day: 11/28 0701 - 11/29 0700 In: 1900 [P.O.:1200; I.V.:650; IV Piggyback:50] Out: -  Intake/Output this shift:    PE: General- In NAD Abdomen-soft, mild tenderness in suprapubic area  Lab Results:   Recent Labs  01/05/14 0517 01/06/14 1300 01/07/14 0538  WBC 6.6  --  7.3  HGB 12.6 13.4 11.7*  HCT 38.1 40.2 34.3*  PLT 342  --  389   BMET  Recent Labs  01/05/14 0517  NA 140  K 3.9  CL 101  CO2 26  GLUCOSE 119*  BUN 10  CREATININE 0.88  CALCIUM 8.9   PT/INR No results for input(s): LABPROT, INR in the last 72 hours. Comprehensive Metabolic Panel:    Component Value Date/Time   NA 140 01/05/2014 0517   NA 137 01/04/2014 0302   K 3.9 01/05/2014 0517   K 3.5* 01/04/2014 0302   CL 101 01/05/2014 0517   CL 98 01/04/2014 0302   CO2 26 01/05/2014 0517   CO2 21 12/28/2013 2149   BUN 10 01/05/2014 0517   BUN 4* 01/04/2014 0302   CREATININE 0.88 01/05/2014 0517   CREATININE 0.80 01/04/2014 0302   GLUCOSE 119* 01/05/2014 0517   GLUCOSE 116* 01/04/2014 0302   CALCIUM 8.9 01/05/2014 0517   CALCIUM 8.8 12/28/2013 2149   AST 10 12/28/2013 2149   ALT 7 12/28/2013 2149   ALKPHOS 74 12/28/2013 2149   BILITOT 0.5 12/28/2013 2149   PROT 6.7 12/28/2013 2149   ALBUMIN 3.1* 12/28/2013 2149     Studies/Results: No results found.  Anti-infectives: Anti-infectives    Start     Dose/Rate Route Frequency Ordered Stop   01/04/14 1400  piperacillin-tazobactam (ZOSYN) IVPB 3.375 g     3.375 g12.5 mL/hr over 240 Minutes Intravenous 3 times per day 01/04/14 0732     01/04/14 0545   piperacillin-tazobactam (ZOSYN) IVPB 3.375 g     3.375 g12.5 mL/hr over 240 Minutes Intravenous  Once 01/04/14 0537 01/04/14 0950      Assessment Principal Problem:   Sigmoid Diverticulitis with abscess s/p percutaneous aspiration-some nausea this AM; still with mild back pain.     LOS: 3 days   Plan:  Do not advance diet.  Continue IV abxs.  Repeat CT in AM.   Kita Neace J 01/07/2014

## 2014-01-07 NOTE — Plan of Care (Signed)
Problem: Phase II Progression Outcomes Goal: Progressing with IS, TCDB Outcome: Completed/Met Date Met:  01/07/14 Goal: Dressings dry/intact Outcome: Not Applicable Date Met:  16/10/96 Goal: Return of bowel function (flatus, BM) IF ABDOMINAL SURGERY:  Outcome: Completed/Met Date Met:  01/07/14 Goal: Discharge plan established Outcome: Completed/Met Date Met:  01/07/14 Goal: Tolerating diet Outcome: Progressing  Problem: Phase III Progression Outcomes Goal: Activity at appropriate level-compared to baseline (UP IN CHAIR FOR HEMODIALYSIS)  Outcome: Completed/Met Date Met:  01/07/14 Patient ambulating in room and hallway. Goal: Voiding independently Outcome: Completed/Met Date Met:  01/07/14 Goal: Nasogastric tube discontinued Outcome: Not Applicable Date Met:  04/54/09 Goal: Discharge plan remains appropriate-arrangements made Outcome: Completed/Met Date Met:  01/07/14

## 2014-01-07 NOTE — Progress Notes (Signed)
ANTIBIOTIC CONSULT NOTE   Pharmacy Consult for:  Zosyn Indication:  Intra-abdominal infection  Allergies  Allergen Reactions  . Vicodin [Hydrocodone-Acetaminophen] Hives    Patient Measurements: Height: 5\' 7"  (170.2 cm) Weight: (!) 301 lb 6.4 oz (136.714 kg) IBW/kg (Calculated) : 61.6   Vital Signs: Temp: 98.4 F (36.9 C) (11/29 0531) Temp Source: Oral (11/29 0531) BP: 107/53 mmHg (11/29 0531) Pulse Rate: 75 (11/29 0531)  Labs:  Recent Labs  01/05/14 0517 01/06/14 1300 01/07/14 0538  WBC 6.6  --  7.3  HGB 12.6 13.4 11.7*  PLT 342  --  389  CREATININE 0.88  --   --    Estimated Creatinine Clearance: 124.1 mL/min (by C-G formula based on Cr of 0.88).    Microbiology: Recent Results (from the past 720 hour(s))  Culture, routine-abscess     Status: None   Collection Time: 01/04/14  8:52 AM  Result Value Ref Range Status   Specimen Description ABSCESS  Final   Special Requests Normal  Final   Gram Stain   Final    ABUNDANT WBC PRESENT,BOTH PMN AND MONONUCLEAR NO SQUAMOUS EPITHELIAL CELLS SEEN ABUNDANT GRAM POSITIVE COCCI IN PAIRS IN CHAINS IN CLUSTERS MODERATE GRAM NEGATIVE RODS Performed at [facility]    Culture   Final    MULTIPLE ORGANISMS PRESENT, NONE PREDOMINANT Note: NO STAPHYLOCOCCUS AUREUS ISOLATED NO GROUP A STREP (S.PYOGENES) ISOLATED Performed at [facility]    Report Status 01/07/2014 FINAL  Final    Medical History: Past Medical History  Diagnosis Date  . Asthma   . Kidney calculi   . Diverticulitis     Assessment: [age] yoF admitted 11/26 with sigmoid diverticular disease, renal stones, and colic. Pt seen at [facility] on 11/19 with same, and has noted no improvement despite antibiotics. Pt given Cipro & metronidazole x 7 days at that time. On admit, 7 cm diverticular abscess was found. Surgery is following. Pharmacy is consulted to dose Zosyn for intra-abdominal infection and abscess.  Pt is s/p CT-guided needle  aspiration of abscess performed 11/26  PMH: Asthma, smoking (ongoing), morbid obesity, kidney stones  11/26 >> Zosyn >>  Tmax: Remains afebrile WBCs: Remains wnl Renal: SCr 0.88- stable, CrCl > 100 CG, 97N  11/26 abscess: multiple organisms, none predominant F  Goal of Therapy:  Appropriate antibiotic dosing for renal function; eradication of infection  Plan:   Zosyn 3.375 grams IV every 8 hours, each dose infused over 4 hours.  F/u renal function, clinical course  Loma BostonLaura Eliga Arvie, PharmD Pager: [phone] 01/07/2014 9:32 AM

## 2014-01-08 ENCOUNTER — Encounter (HOSPITAL_COMMUNITY): Payer: Self-pay | Admitting: Radiology

## 2014-01-08 ENCOUNTER — Ambulatory Visit (HOSPITAL_COMMUNITY): Payer: Medicaid Other

## 2014-01-08 MED ORDER — IOHEXOL 300 MG/ML  SOLN
25.0000 mL | INTRAMUSCULAR | Status: AC
  Administered 2014-01-08 (×2): 25 mL via ORAL

## 2014-01-08 MED ORDER — IOHEXOL 300 MG/ML  SOLN
100.0000 mL | Freq: Once | INTRAMUSCULAR | Status: AC | PRN
  Administered 2014-01-08: 100 mL via INTRAVENOUS

## 2014-01-08 MED ORDER — METRONIDAZOLE 500 MG PO TABS
500.0000 mg | ORAL_TABLET | Freq: Three times a day (TID) | ORAL | Status: DC
  Administered 2014-01-08 – 2014-01-09 (×4): 500 mg via ORAL
  Filled 2014-01-08 (×6): qty 1

## 2014-01-08 MED ORDER — AMOXICILLIN-POT CLAVULANATE 875-125 MG PO TABS
1.0000 | ORAL_TABLET | Freq: Two times a day (BID) | ORAL | Status: DC
  Administered 2014-01-08 – 2014-01-09 (×3): 1 via ORAL
  Filled 2014-01-08 (×4): qty 1

## 2014-01-08 NOTE — Progress Notes (Signed)
Subjective: She is feeling better on her 2nd cup of contrast, CT pending.  Objective: Vital signs in last 24 hours: Temp:  [97.5 F (36.4 C)-98.7 F (37.1 C)] 98.4 F (36.9 C) (11/30 0602) Pulse Rate:  [51-68] 59 (11/30 0602) Resp:  [16-20] 20 (11/30 0602) BP: (95-126)/(53-80) 103/53 mmHg (11/30 0602) SpO2:  [93 %-99 %] 93 % (11/30 0602) Weight:  [134.9 kg (297 lb 6.4 oz)] 134.9 kg (297 lb 6.4 oz) (11/30 0602) Last BM Date: 01/08/14 Diet:  Full liquids PO 1080 yesterday 3 stools Afebrile, VSS CBC OK Starting day 6 on Zosyn CT drain: 01/04/14 - CT pending today. Intake/Output from previous day: 11/29 0701 - 11/30 0700 In: 1330 [P.O.:1080; I.V.:200; IV Piggyback:50] Out: -  Intake/Output this shift:    General appearance: alert, cooperative and no distress GI: soft, non-tender; bowel sounds normal; no masses,  no organomegaly and still has some deep pain at time, but it sounds like it is better.  Lab Results:   Recent Labs  01/06/14 1300 01/07/14 0538  WBC  --  7.3  HGB 13.4 11.7*  HCT 40.2 34.3*  PLT  --  389    BMET No results for input(s): NA, K, CL, CO2, GLUCOSE, BUN, CREATININE, CALCIUM in the last 72 hours. PT/INR No results for input(s): LABPROT, INR in the last 72 hours.  No results for input(s): AST, ALT, ALKPHOS, BILITOT, PROT, ALBUMIN in the last 168 hours.   Lipase     Component Value Date/Time   LIPASE 7* 12/28/2013 2149     Studies/Results: No results found.  Medications: . acidophilus  1 capsule Oral Daily  . piperacillin-tazobactam (ZOSYN)  IV  3.375 g Intravenous 3 times per day   . sodium chloride 50 mL/hr at 01/07/14 0737   Prior to Admission medications   Medication Sig Start Date End Date Taking? Authorizing Provider  ciprofloxacin (CIPRO) 500 MG tablet Take 1 tablet (500 mg total) by mouth 2 (two) times daily. One po bid x 7 days 12/28/13  Yes Toy CookeyMegan Docherty, MD  metroNIDAZOLE (FLAGYL) 500 MG tablet Take 1 tablet (500 mg  total) by mouth 2 (two) times daily. One po bid x 7 days 12/28/13  Yes Toy CookeyMegan Docherty, MD  oxyCODONE-acetaminophen (PERCOCET/ROXICET) 5-325 MG per tablet Take 1-2 tablets by mouth every 6 (six) hours as needed for moderate pain or severe pain. 12/28/13  Yes Toy CookeyMegan Docherty, MD  promethazine (PHENERGAN) 25 MG tablet Take 25 mg by mouth every 6 (six) hours as needed for nausea or vomiting.   Yes Historical Provider, MD  azithromycin (ZITHROMAX) 250 MG tablet Take 2 tablets daily for 3 days. Patient not taking: Reported on 01/04/2014 08/25/12   Carlisle BeersJohn L Molpus, MD  cyclobenzaprine (FLEXERIL) 10 MG tablet Take 1 tablet (10 mg total) by mouth 3 (three) times daily as needed for muscle spasms. Patient not taking: Reported on 01/04/2014 07/01/12   Geoffery Lyonsouglas Delo, MD  ibuprofen (ADVIL,MOTRIN) 800 MG tablet Take 1 tablet (800 mg total) by mouth 3 (three) times daily. Patient not taking: Reported on 01/04/2014 01/25/12   Elson AreasLeslie K Sofia, PA-C  ondansetron (ZOFRAN) 4 MG tablet Take 1 tablet (4 mg total) by mouth every 6 (six) hours. 12/28/13   Toy CookeyMegan Docherty, MD     Assessment/Plan Sigmoid Diverticulitis with abscess s/p percutaneous aspiration/no drain Hx of asthma Prior hysterectomy  Body mass index is 46.57 kg/(m^2). DVT- I have added SCD's.   Plan:  CT pending, day 6 of Zosyn.    LOS: 4  days    Jojo Pehl 01/08/2014

## 2014-01-08 NOTE — Progress Notes (Signed)
[facility] PROGRESS NOTE  Sonia Bennett ZDG:[id number] DOB: 1975/03/04 DOA: 01/04/2014 PCP: No primary care provider on file. Interim summary Sonia Heritagemanda Paver is a 38 y.o. female with a history of Asthma and Depression who presents to the ED with complaints of LLQ and lower ABD Pain x 1 week. She had been seen in the ED on 11/19 and found to have a UTI and Rrenal stones and Diverticulitis and was placed on Bactrim RX but she returned tonight due to continued pain and nausea. A CT scan of the ABD was performed and revealed a Diverticular Abscess and she was placed on IV Zosyn and referred for medical admission. General Surgery was consulted Assessment/Plan: Diverticulitis with abscess formation s/p CT guided aspiration of the abscess. She is clinically improving on IV fluids and IV antibiotics. Surgery consulted and recommendations given. startedher on clear liquid diet and she was able to tolerate without nasuea or vomiting. Her diet was advanced and  Her abdominal pain is improving . No bloody bowel movements. Repeat CT shows improvement in the abscess size. Her symptoms have much improved.   2. Hypokalemia Repleted as needed.     Code Status: full code.  Family Communication: none at bedside Disposition Plan: pending.    Consultants:  Surgery   Procedures:  CT guided aspiration of the abscess.   Antibiotics:  IV zosyn  HPI/Subjective: No bloody bowel movements. On full liquiddiet. Plan to advance to soft diet  Objective: Filed Vitals:   01/08/14 1445  BP: 119/61  Pulse: 68  Temp: 98.4 F (36.9 C)  Resp: 20    Intake/Output Summary (Last 24 hours) at 01/08/14 1740 Last data filed at 01/08/14 1200  Gross per 24 hour  Intake    720 ml  Output      0 ml  Net    720 ml   Filed Weights   01/04/14 0703 01/05/14 2053 01/08/14 0602  Weight: 138.4 kg (305 lb 1.9 oz) 136.714 kg (301 lb 6.4 oz) 134.9 kg (297 lb 6.4 oz)    Exam:   General:  Alert afebrile  comfortable  Cardiovascular: s1s2  Respiratory: ctab  Abdomen: soft mild left lower quadrant tenderness. Non distended. Bowel sounds heard.   Musculoskeletal: no pedal edema.   Data Reviewed: Basic Metabolic Panel:  Recent Labs Lab 01/04/14 0302 01/05/14 0517  NA 137 140  K 3.5* 3.9  CL 98 101  CO2  --  26  GLUCOSE 116* 119*  BUN 4* 10  CREATININE 0.80 0.88  CALCIUM  --  8.9   Liver Function Tests: No results for input(s): AST, ALT, ALKPHOS, BILITOT, PROT, ALBUMIN in the last 168 hours. No results for input(s): LIPASE, AMYLASE in the last 168 hours. No results for input(s): AMMONIA in the last 168 hours. CBC:  Recent Labs Lab 01/04/14 0233 01/04/14 0302 01/05/14 0517 01/06/14 1300 01/07/14 0538  WBC 8.5  --  6.6  --  7.3  NEUTROABS 6.4  --   --   --   --   HGB 12.4 12.9 12.6 13.4 11.7*  HCT 36.7 38.0 38.1 40.2 34.3*  MCV 94.6  --  95.5  --  94.0  PLT 336  --  342  --  389   Cardiac Enzymes: No results for input(s): CKTOTAL, CKMB, CKMBINDEX, TROPONINI in the last 168 hours. BNP (last 3 results) No results for input(s): PROBNP in the last 8760 hours. CBG: No results for input(s): GLUCAP in the last 168 hours.  Recent Results (from  the past 240 hour(s))  Culture, routine-abscess     Status: None   Collection Time: 01/04/14  8:52 AM  Result Value Ref Range Status   Specimen Description ABSCESS  Final   Special Requests Normal  Final   Gram Stain   Final    ABUNDANT WBC PRESENT,BOTH PMN AND MONONUCLEAR NO SQUAMOUS EPITHELIAL CELLS SEEN ABUNDANT GRAM POSITIVE COCCI IN PAIRS IN CHAINS IN CLUSTERS MODERATE GRAM NEGATIVE RODS Performed at [facility]    Culture   Final    MULTIPLE ORGANISMS PRESENT, NONE PREDOMINANT Note: NO STAPHYLOCOCCUS AUREUS ISOLATED NO GROUP A STREP (S.PYOGENES) ISOLATED Performed at [facility]    Report Status 01/07/2014 FINAL  Final     Studies: Ct Abdomen Pelvis W Contrast  01/08/2014   CLINICAL DATA:   Pelvic pain. Status post drain insertion with subsequent removal.  EXAM: CT ABDOMEN AND PELVIS WITH CONTRAST  TECHNIQUE: Multidetector CT imaging of the abdomen and pelvis was performed using the standard protocol following bolus administration of intravenous contrast.  CONTRAST:  100mL OMNIPAQUE IOHEXOL 300 MG/ML  SOLN  COMPARISON:  CT abdomen 01/04/2014, 12/28/2013  FINDINGS: The lung bases are clear.  The liver is diffusely low in attenuation likely secondary to hepatic steatosis. There is no intrahepatic or extrahepatic biliary ductal dilatation. The gallbladder is normal. The spleen demonstrates no focal abnormality.There are punctate nonobstructing left renal calculi. The right kidney, adrenal glands and pancreas are normal. The bladder is unremarkable.  The stomach, duodenum, small intestine, and large intestine demonstrate no contrast extravasation or dilatation. There is bowel wall thickening and surrounding inflammatory changes involving the sigmoid colon consistent with diverticulitis with a small adjacent 3 x 1.9 cm peridiverticular fluid collection. There is a normal caliber appendix in the right lower quadrant without periappendiceal inflammatory changes. There is no pneumoperitoneum, pneumatosis, or portal venous gas. There is no abdominal or pelvic free fluid. There is no lymphadenopathy.  The abdominal aorta is normal in caliber.  There are no lytic or sclerotic osseous lesions.  IMPRESSION: 1. Sigmoid diverticulitis with a small 3 x 1.9 cm peridiverticular fluid collection. The overall appearance has improved compared with 01/04/2014. 2. Hepatic steatosis. 3. Left nephrolithiasis.   Electronically Signed   By: Elige KoHetal  Patel   On: 01/08/2014 11:48    Scheduled Meds: . acidophilus  1 capsule Oral Daily  . amoxicillin-clavulanate  1 tablet Oral Q12H  . metroNIDAZOLE  500 mg Oral 3 times per day  . piperacillin-tazobactam (ZOSYN)  IV  3.375 g Intravenous 3 times per day   Continuous  Infusions: . sodium chloride 50 mL/hr at 01/07/14 [id number]    Principal Problem:   Diverticulitis Active Problems:   Colonic diverticular abscess   Asthma   Kidney calculi   Abdominal pain, LLQ    Time spent: 15 minutes.     [facility],Aloha Bartok  [facility] Pager [phone] If 7PM-7AM, please contact night-coverage at [email or web address], password [email or web address] 01/08/2014, 5:40 PM  LOS: 4 days

## 2014-01-09 DIAGNOSIS — K5720 Diverticulitis of large intestine with perforation and abscess without bleeding: Principal | ICD-10-CM

## 2014-01-09 LAB — BASIC METABOLIC PANEL
Anion gap: 14 (ref 5–15)
BUN: 4 mg/dL — AB (ref 6–23)
CALCIUM: 9.2 mg/dL (ref 8.4–10.5)
CO2: 25 mEq/L (ref 19–32)
Chloride: 103 mEq/L (ref 96–112)
Creatinine, Ser: 0.79 mg/dL (ref 0.50–1.10)
GFR calc Af Amer: >90 mL/min (ref >90)
GLUCOSE: 110 mg/dL — AB (ref 70–99)
Potassium: 3.8 mEq/L (ref 3.7–5.3)
Sodium: 142 mEq/L (ref 137–147)

## 2014-01-09 MED ORDER — AMOXICILLIN-POT CLAVULANATE 875-125 MG PO TABS: 1.0000 | ORAL_TABLET | Freq: Two times a day (BID) | ORAL | Status: DC

## 2014-01-09 MED ORDER — METRONIDAZOLE 500 MG PO TABS: 500.0000 mg | ORAL_TABLET | Freq: Three times a day (TID) | ORAL | Status: DC

## 2014-01-09 MED ORDER — OXYCODONE-ACETAMINOPHEN 5-325 MG PO TABS: 1.0000 | ORAL_TABLET | Freq: Four times a day (QID) | ORAL | Status: DC | PRN

## 2014-01-09 MED ORDER — SODIUM CHLORIDE 0.9 % IV BOLUS (SEPSIS)
1000.0000 mL | Freq: Once | INTRAVENOUS | Status: AC
  Administered 2014-01-09: 1000 mL via INTRAVENOUS

## 2014-01-09 NOTE — Care Management Note (Signed)
CARE MANAGEMENT NOTE 01/09/2014  Patient:  Sonia HeritageHEY,Sonia Bennett   Account Number:  [id number]  Date Initiated:  01/09/2014  Documentation initiated by:  Sandford CrazeLEMENTS,[name]  Subjective/Objective Assessment:   38 yo admitted with diverticulitis. History of Asthma and Depression     Action/Plan:   From home with husband and children.   Anticipated DC Date:  01/10/2014   Anticipated DC Plan:  HOME/SELF CARE      DC Planning Services  CM consult  [facility]      Choice offered to / List presented to:             Status of service:  Completed, signed off Medicare Important Message given?   (If response is "NO", the following Medicare IM given date fields will be blank) Date Medicare IM given:   Medicare IM given by:   Date Additional Medicare IM given:   Additional Medicare IM given by:    Discharge Disposition:    Per UR Regulation:  Reviewed for med. necessity/level of care/duration of stay  If discussed at Long Length of Stay Meetings, dates discussed:   01/09/2014    Comments:  01/09/14 Sandford Crazeora Minh Jasper RN,BSN,NCM [phone] Pt without PCP.  Pt given info about [facility] and was encouraged to call and make a follow up appointment today prior to DC.  Pt states that she will and she will go there at DC with her DC prescriptions to receive help with her meds.  Pt appreciative of CM assistance.

## 2014-01-09 NOTE — Plan of Care (Signed)
Problem: Phase II Progression Outcomes Goal: Pain controlled Outcome: Completed/Met Date Met:  01/09/14 Goal: Tolerating diet Outcome: Completed/Met Date Met:  01/09/14 Goal: Other Phase II Outcomes/Goals Outcome: Not Met (add Reason)  Problem: Phase III Progression Outcomes Goal: Pain controlled on oral analgesia Outcome: Completed/Met Date Met:  01/09/14 Goal: IV changed to normal saline lock Outcome: Completed/Met Date Met:  01/09/14 Goal: Demonstrates TCDB, IS independently Outcome: Not Applicable Date Met:  64/84/72 Goal: Other Phase III Outcomes/Goals Outcome: Not Applicable Date Met:  08/29/80  Problem: Discharge Progression Outcomes Goal: Barriers To Progression Addressed/Resolved Outcome: Completed/Met Date Met:  01/09/14 Goal: Discharge plan in place and appropriate Outcome: Completed/Met Date Met:  01/09/14 Goal: Pain controlled with appropriate interventions Outcome: Completed/Met Date Met:  01/09/14 Goal: Hemodynamically stable Outcome: Completed/Met Date Met:  88/33/74 Goal: Complications resolved/controlled Outcome: Completed/Met Date Met:  01/09/14 Goal: Tolerating diet Outcome: Completed/Met Date Met:  01/09/14 Goal: Activity appropriate for discharge plan Outcome: Completed/Met Date Met:  01/09/14 Goal: Tubes and drains discontinued if indicated Outcome: Not Applicable Date Met:  45/14/60 Goal: Staples/sutures removed Outcome: Not Applicable Date Met:  47/99/87 Goal: Steri-Strips applied Outcome: Not Applicable Date Met:  21/58/72 Goal: Other Discharge Outcomes/Goals Outcome: Not Applicable Date Met:  76/18/48

## 2014-01-09 NOTE — Discharge Summary (Signed)
Physician Discharge Summary  Sonia Bennett WUJ:[id number] DOB: 1975-10-14 DOA: 01/04/2014  PCP: No primary care provider on file.  Admit date: 01/04/2014 Discharge date: 01/09/2014  Time spent: 30 minutes  Recommendations for Outpatient Follow-up:  1. Follow p with PCP in one week.   Discharge Diagnoses:  Principal Problem:   Diverticulitis Active Problems:   Colonic diverticular abscess   Asthma   Kidney calculi   Abdominal pain, LLQ   Discharge Condition: improved.  Diet recommendation: regualar  Filed Weights   01/04/14 0703 01/05/14 2053 01/08/14 0602  Weight: 138.4 kg (305 lb 1.9 oz) 136.714 kg (301 lb 6.4 oz) 134.9 kg (297 lb 6.4 oz)    History of present illness:  Sonia Bennett is a 38 y.o. female with a history of Asthma and Depression who presents to the ED with complaints of LLQ and lower ABD Pain x 1 week. She had been seen in the ED on 11/19 and found to have a UTI and Rrenal stones and Diverticulitis and was placed on Bactrim RX but she returned tonight due to continued pain and nausea. A CT scan of the ABD was performed and revealed a Diverticular Abscess and she was placed on IV Zosyn and referred for medical admission. General Surgery was consulted  Hospital Course:  Diverticulitis with abscess formation s/p CT guided aspiration of the abscess. She is clinically improving on IV fluids and IV antibiotics. Surgery consulted and recommendations given. startedher on clear liquid diet and she was able to tolerate without nasuea or vomiting. Her diet was advanced and Her abdominal pain is improving . No bloody bowel movements. Repeat CT shows improvement in the abscess size. Her symptoms have much improved. She was discharged on oral antibiotics and recommended to follow up with surgery as needed.   2. Hypokalemia Repleted as needed.    Procedures:  Ct guided aspiration  Consultations:  surgery  Discharge Exam: Filed Vitals:   01/09/14 1615  BP: 116/52   Pulse: 53  Temp: 97.7 F (36.5 C)  Resp: 18    General: alert afebrile comfortable Cardiovascular: s1s2 Respiratory: ctab  Discharge Instructions You were cared for by a hospitalist during your hospital stay. If you have any questions about your discharge medications or the care you received while you were in the hospital after you are discharged, you can call the unit and asked to speak with the hospitalist on call if the hospitalist that took care of you is not available. Once you are discharged, your primary care physician will handle any further medical issues. Please note that NO REFILLS for any discharge medications will be authorized once you are discharged, as it is imperative that you return to your primary care physician (or establish a relationship with a primary care physician if you do not have one) for your aftercare needs so that they can reassess your need for medications and monitor your lab values.  Discharge Instructions    Call MD for:  persistant dizziness or light-headedness    Complete by:  As directed      Call MD for:  persistant nausea and vomiting    Complete by:  As directed      Call MD for:  severe uncontrolled pain    Complete by:  As directed      Call MD for:  temperature >100.4    Complete by:  As directed      Discharge instructions    Complete by:  As directed   Follow up with  PCP in 1 to 2 weeks.  Follow up with surgeon as needed.          Current Discharge Medication List    START taking these medications   Details  amoxicillin-clavulanate (AUGMENTIN) 875-125 MG per tablet Take 1 tablet by mouth every 12 (twelve) hours. Qty: 14 tablet, Refills: 0      CONTINUE these medications which have CHANGED   Details  metroNIDAZOLE (FLAGYL) 500 MG tablet Take 1 tablet (500 mg total) by mouth every 8 (eight) hours. Qty: 21 tablet, Refills: 0      CONTINUE these medications which have NOT CHANGED   Details  oxyCODONE-acetaminophen  (PERCOCET/ROXICET) 5-325 MG per tablet Take 1-2 tablets by mouth every 6 (six) hours as needed for moderate pain or severe pain. Qty: 20 tablet, Refills: 0    promethazine (PHENERGAN) 25 MG tablet Take 25 mg by mouth every 6 (six) hours as needed for nausea or vomiting.    ondansetron (ZOFRAN) 4 MG tablet Take 1 tablet (4 mg total) by mouth every 6 (six) hours. Qty: 15 tablet, Refills: 0      STOP taking these medications     ciprofloxacin (CIPRO) 500 MG tablet      azithromycin (ZITHROMAX) 250 MG tablet      cyclobenzaprine (FLEXERIL) 10 MG tablet      ibuprofen (ADVIL,MOTRIN) 800 MG tablet        Allergies  Allergen Reactions  . Vicodin [Hydrocodone-Acetaminophen] Hives   Follow-up Information    Follow up with [facility].   Why:  As needed   Contact information:   [address] [place] [id number] [phone]       The results of significant diagnostics from this hospitalization (including imaging, microbiology, ancillary and laboratory) are listed below for reference.    Significant Diagnostic Studies: Ct Abdomen Pelvis W Contrast  01/08/2014   CLINICAL DATA:  Pelvic pain. Status post drain insertion with subsequent removal.  EXAM: CT ABDOMEN AND PELVIS WITH CONTRAST  TECHNIQUE: Multidetector CT imaging of the abdomen and pelvis was performed using the standard protocol following bolus administration of intravenous contrast.  CONTRAST:  100mL OMNIPAQUE IOHEXOL 300 MG/ML  SOLN  COMPARISON:  CT abdomen 01/04/2014, 12/28/2013  FINDINGS: The lung bases are clear.  The liver is diffusely low in attenuation likely secondary to hepatic steatosis. There is no intrahepatic or extrahepatic biliary ductal dilatation. The gallbladder is normal. The spleen demonstrates no focal abnormality.There are punctate nonobstructing left renal calculi. The right kidney, adrenal glands and pancreas are normal. The bladder is unremarkable.  The  stomach, duodenum, small intestine, and large intestine demonstrate no contrast extravasation or dilatation. There is bowel wall thickening and surrounding inflammatory changes involving the sigmoid colon consistent with diverticulitis with a small adjacent 3 x 1.9 cm peridiverticular fluid collection. There is a normal caliber appendix in the right lower quadrant without periappendiceal inflammatory changes. There is no pneumoperitoneum, pneumatosis, or portal venous gas. There is no abdominal or pelvic free fluid. There is no lymphadenopathy.  The abdominal aorta is normal in caliber.  There are no lytic or sclerotic osseous lesions.  IMPRESSION: 1. Sigmoid diverticulitis with a small 3 x 1.9 cm peridiverticular fluid collection. The overall appearance has improved compared with 01/04/2014. 2. Hepatic steatosis. 3. Left nephrolithiasis.   Electronically Signed   By: Elige KoHetal  Patel   On: 01/08/2014 11:48   Ct Abdomen Pelvis W Contrast  01/04/2014   CLINICAL DATA:  Left flank  pain.  EXAM: CT ABDOMEN AND PELVIS WITH CONTRAST  TECHNIQUE: Multidetector CT imaging of the abdomen and pelvis was performed using the standard protocol following bolus administration of intravenous contrast.  CONTRAST:  100mL OMNIPAQUE IOHEXOL 300 MG/ML  SOLN  COMPARISON:  12/28/2013  FINDINGS: BODY WALL: Unremarkable.  LOWER CHEST: Unremarkable.  ABDOMEN/PELVIS:  Liver: Suspect mild steatosis.  Biliary: No evidence of biliary obstruction or stone.  Pancreas: Unremarkable.  Spleen: Unremarkable.  Adrenals: Unremarkable.  Kidneys and ureters: 2 small stones present within the upper left kidney, nonobstructive. No hydronephrosis.  Bladder: Decompressed. No gas in the urinary bladder to suggest fistula  Reproductive: Hysterectomy.  Probable oophorectomies.  Bowel: Previously noted diverticulitis has progressed, now with a gas and fluid containing abscess which measures 3 by 7 by 4 cm. Fat and peritoneal inflammatory changes in the pelvis are  extensive. There is no free pneumoperitoneum. No bowel obstruction. Normal appendix.  Vascular: No acute abnormality.  OSSEOUS: No acute abnormalities.  These results were called by telephone at the time of interpretation on 01/04/2014 at 5:36 am to Dr. Earley FavorGAIL SCHULZ , who verbally acknowledged these results.  IMPRESSION: 1. Sigmoid diverticulitis with contained perforation causing 3 x 7 x 4 cm abscess. 2. Left nephrolithiasis.   Electronically Signed   By: Tiburcio PeaJonathan  Watts M.D.   On: 01/04/2014 05:38   Ct Aspiration  01/04/2014   CLINICAL DATA:  Pelvic diverticular abscess  EXAM: CT GUIDED DRAINAGE OF PELVIC DIVERTICULAR ABSCESS  ANESTHESIA/SEDATION: Four Mg IV Versed 175 mcg IV Fentanyl  Total Moderate Sedation Time:  25 minutes  PROCEDURE: The procedure, risks, benefits, and alternatives were explained to the patient. Questions regarding the procedure were encouraged and answered. The patient understands and consents to the procedure.  The right trans gluteal area was prepped with Betadinein a sterile fashion, and a sterile drape was applied covering the operative field. A sterile gown and sterile gloves were used for the procedure. Local anesthesia was provided with 1% Lidocaine.  Previous imaging reviewed. Patient positioned prone. Noncontrast localization CT performed through the pelvis. The small pericolonic diverticular abscess was localized. From a right trans gluteal approach, a 20 cm 18 gauge access needle was advanced percutaneously under CT guidance into the small abscess. Needle position traverses very close to the colon wall on the right side. Needle position is confirmed within the abscess cavity. Syringe aspiration yielded 35 cc purulent fluid. Sample sent for Gram stain and culture. Because of the percutaneous needle access close to the colonic wall on the right side and the depth of the abscess, drain catheter was not placed.(Risk of adjacent bowel injury). The abscess was nearly completely  collapsed by Needle aspiration.  COMPLICATIONS: None immediate  FINDINGS: Imaging confirms needle placement in the diverticular pelvic abscess for drainage  IMPRESSION: Successful CT-guided pelvic diverticular abscess needle aspiration for drainage. Gram stain and culture sent.   Electronically Signed   By: Ruel Favorsrevor  Shick M.D.   On: 01/04/2014 13:12   Ct Renal Stone Study  12/28/2013   CLINICAL DATA:  Lower pelvic pain, nausea, constipation, pain when attempting to urinate, have a bowel movement or passed gas, suprapubic pain, fever, hematuria, smoker  EXAM: CT ABDOMEN AND PELVIS WITHOUT CONTRAST  TECHNIQUE: Multidetector CT imaging of the abdomen and pelvis was performed following the standard protocol without IV contrast. Sagittal and coronal MPR images reconstructed from axial data set.  COMPARISON:  None  FINDINGS: Lung bases clear.  Two tiny nonobstructing LEFT renal calculi.  Liver, spleen, pancreas,  kidneys, and adrenal glands otherwise normal for noncontrast technique.  Normal appendix.  Inflammatory process in pelvis centered at the distal sigmoid colon, which demonstrates wall thickening and pericolic inflammatory changes.  Questionable visualization of a single sigmoid colonic diverticulum.  Findings may either represent acute diverticulitis or colitis with significant pericolic inflammatory changes, favor diverticulitis.  No evidence of abscess.  Coronal images demonstrate questionably a single tiny focus of extraluminal gas in the pelvis image 55.  Bladder decompressed.  Scattered pelvic phleboliths.  Uterus surgically absent with nonvisualization of ovaries.  Stomach and small bowel loops normal.  Additional colonic diverticulum noted at hepatic flexure image 33.  No mass, adenopathy, or free intraperitoneal air.  No hernia or acute bone lesions.  IMPRESSION: Nonobstructing LEFT renal calculi.  Inflammatory process centered at the distal sigmoid colon where wall thickening and pericolic and  infiltrative changes are identified, question new with a single focus of extraluminal gas.  Findings favor sigmoid diverticulitis with a single focus of mesenteric gas, colitis considered a less likely etiology.  No evidence of abscess or free intraperitoneal air.   Electronically Signed   By: Ulyses Southward M.D.   On: 12/28/2013 23:01    Microbiology: Recent Results (from the past 240 hour(s))  Culture, routine-abscess     Status: None   Collection Time: 01/04/14  8:52 AM  Result Value Ref Range Status   Specimen Description ABSCESS  Final   Special Requests Normal  Final   Gram Stain   Final    ABUNDANT WBC PRESENT,BOTH PMN AND MONONUCLEAR NO SQUAMOUS EPITHELIAL CELLS SEEN ABUNDANT GRAM POSITIVE COCCI IN PAIRS IN CHAINS IN CLUSTERS MODERATE GRAM NEGATIVE RODS Performed at [facility]    Culture   Final    MULTIPLE ORGANISMS PRESENT, NONE PREDOMINANT Note: NO STAPHYLOCOCCUS AUREUS ISOLATED NO GROUP A STREP (S.PYOGENES) ISOLATED Performed at [facility]    Report Status 01/07/2014 FINAL  Final     Labs: Basic Metabolic Panel:  Recent Labs Lab 01/04/14 0302 01/05/14 0517 01/09/14 0436  NA 137 140 142  K 3.5* 3.9 3.8  CL 98 101 103  CO2  --  26 25  GLUCOSE 116* 119* 110*  BUN 4* 10 4*  CREATININE 0.80 0.88 0.79  CALCIUM  --  8.9 9.2   Liver Function Tests: No results for input(s): AST, ALT, ALKPHOS, BILITOT, PROT, ALBUMIN in the last 168 hours. No results for input(s): LIPASE, AMYLASE in the last 168 hours. No results for input(s): AMMONIA in the last 168 hours. CBC:  Recent Labs Lab 01/04/14 0233 01/04/14 0302 01/05/14 0517 01/06/14 1300 01/07/14 0538  WBC 8.5  --  6.6  --  7.3  NEUTROABS 6.4  --   --   --   --   HGB 12.4 12.9 12.6 13.4 11.7*  HCT 36.7 38.0 38.1 40.2 34.3*  MCV 94.6  --  95.5  --  94.0  PLT 336  --  342  --  389   Cardiac Enzymes: No results for input(s): CKTOTAL, CKMB, CKMBINDEX, TROPONINI in the last 168 hours. BNP: BNP  (last 3 results) No results for input(s): PROBNP in the last 8760 hours. CBG: No results for input(s): GLUCAP in the last 168 hours.     SignedKathlen Mody  [facility] 01/09/2014, 6:48 PM

## 2014-01-09 NOTE — Progress Notes (Signed)
Subjective: She is doing well, still some mid lower abdomen.  Tolertating diet, and stools becoming more normal.  She has been started on PO flagyl and Augmentin.    Objective: Vital signs in last 24 hours: Temp:  [97.8 F (36.6 C)-98.4 F (36.9 C)] 97.8 F (36.6 C) (12/01 0523) Pulse Rate:  [52-72] 52 (12/01 0523) Resp:  [16-20] 16 (12/01 0523) BP: (97-119)/(52-67) 97/52 mmHg (12/01 0523) SpO2:  [94 %-97 %] 94 % (12/01 0523) Last BM Date: 01/08/14 I/O=? Soft diet yesterday Afebrile, VSS Labs OK CT yesterday shows thickening sigmoid colon with 3 x 1.9 cm fluid collection, improved from last CT 3 x 7 x 4 cm. Intake/Output from previous day: 11/30 0701 - 12/01 0700 In: 240 [P.O.:240] Out: -  Intake/Output this shift: Total I/O In: 120 [P.O.:120] Out: -   General appearance: alert, cooperative and no distress GI: soft, still a little tender mid lower abdomen.  Much bettter.  Lab Results:   Recent Labs  01/06/14 1300 01/07/14 0538  WBC  --  7.3  HGB 13.4 11.7*  HCT 40.2 34.3*  PLT  --  389    BMET  Recent Labs  01/09/14 0436  NA 142  K 3.8  CL 103  CO2 25  GLUCOSE 110*  BUN 4*  CREATININE 0.79  CALCIUM 9.2   PT/INR No results for input(s): LABPROT, INR in the last 72 hours.  No results for input(s): AST, ALT, ALKPHOS, BILITOT, PROT, ALBUMIN in the last 168 hours.   Lipase     Component Value Date/Time   LIPASE 7* 12/28/2013 2149     Studies/Results: Ct Abdomen Pelvis W Contrast  01/08/2014   CLINICAL DATA:  Pelvic pain. Status post drain insertion with subsequent removal.  EXAM: CT ABDOMEN AND PELVIS WITH CONTRAST  TECHNIQUE: Multidetector CT imaging of the abdomen and pelvis was performed using the standard protocol following bolus administration of intravenous contrast.  CONTRAST:  100mL OMNIPAQUE IOHEXOL 300 MG/ML  SOLN  COMPARISON:  CT abdomen 01/04/2014, 12/28/2013  FINDINGS: The lung bases are clear.  The liver is diffusely low in  attenuation likely secondary to hepatic steatosis. There is no intrahepatic or extrahepatic biliary ductal dilatation. The gallbladder is normal. The spleen demonstrates no focal abnormality.There are punctate nonobstructing left renal calculi. The right kidney, adrenal glands and pancreas are normal. The bladder is unremarkable.  The stomach, duodenum, small intestine, and large intestine demonstrate no contrast extravasation or dilatation. There is bowel wall thickening and surrounding inflammatory changes involving the sigmoid colon consistent with diverticulitis with a small adjacent 3 x 1.9 cm peridiverticular fluid collection. There is a normal caliber appendix in the right lower quadrant without periappendiceal inflammatory changes. There is no pneumoperitoneum, pneumatosis, or portal venous gas. There is no abdominal or pelvic free fluid. There is no lymphadenopathy.  The abdominal aorta is normal in caliber.  There are no lytic or sclerotic osseous lesions.  IMPRESSION: 1. Sigmoid diverticulitis with a small 3 x 1.9 cm peridiverticular fluid collection. The overall appearance has improved compared with 01/04/2014. 2. Hepatic steatosis. 3. Left nephrolithiasis.   Electronically Signed   By: Elige KoHetal  Patel   On: 01/08/2014 11:48    Medications: . acidophilus  1 capsule Oral Daily  . amoxicillin-clavulanate  1 tablet Oral Q12H  . metroNIDAZOLE  500 mg Oral 3 times per day  . piperacillin-tazobactam (ZOSYN)  IV  3.375 g Intravenous 3 times per day    Assessment/Plan Sigmoid Diverticulitis with abscess s/p percutaneous aspiration/no  drain Hx of asthma Prior hysterectomy Body mass index is 46.57 kg/(m^2). DVT-  SCD's.   Plan:  Continue medical management.    LOS: 5 days    Moustapha Tooker 01/09/2014

## 2014-01-19 ENCOUNTER — Ambulatory Visit: Payer: Self-pay | Attending: Family Medicine | Admitting: Family Medicine

## 2014-01-19 ENCOUNTER — Encounter: Payer: Self-pay | Admitting: Family Medicine

## 2014-01-19 VITALS — BP 123/85 | HR 96 | Temp 98.7°F | Resp 18 | Ht 67.0 in | Wt 293.0 lb

## 2014-01-19 DIAGNOSIS — F329 Major depressive disorder, single episode, unspecified: Secondary | ICD-10-CM | POA: Insufficient documentation

## 2014-01-19 DIAGNOSIS — K5720 Diverticulitis of large intestine with perforation and abscess without bleeding: Secondary | ICD-10-CM

## 2014-01-19 DIAGNOSIS — F418 Other specified anxiety disorders: Secondary | ICD-10-CM

## 2014-01-19 DIAGNOSIS — F17200 Nicotine dependence, unspecified, uncomplicated: Secondary | ICD-10-CM | POA: Insufficient documentation

## 2014-01-19 DIAGNOSIS — N200 Calculus of kidney: Secondary | ICD-10-CM

## 2014-01-19 DIAGNOSIS — R300 Dysuria: Secondary | ICD-10-CM

## 2014-01-19 DIAGNOSIS — K5732 Diverticulitis of large intestine without perforation or abscess without bleeding: Secondary | ICD-10-CM

## 2014-01-19 DIAGNOSIS — F419 Anxiety disorder, unspecified: Secondary | ICD-10-CM | POA: Insufficient documentation

## 2014-01-19 DIAGNOSIS — F32A Depression, unspecified: Secondary | ICD-10-CM

## 2014-01-19 LAB — POCT URINALYSIS DIPSTICK
Bilirubin, UA: NEGATIVE
Glucose, UA: NEGATIVE
Ketones, UA: NEGATIVE
NITRITE UA: NEGATIVE
PH UA: 5.5
Spec Grav, UA: 1.02
UROBILINOGEN UA: 0.2

## 2014-01-19 MED ORDER — FLUOXETINE HCL 20 MG PO TABS
20.0000 mg | ORAL_TABLET | Freq: Every day | ORAL | Status: DC
Start: 2014-01-19 — End: 2014-02-21

## 2014-01-19 MED ORDER — TAMSULOSIN HCL 0.4 MG PO CAPS: 0.4000 mg | ORAL_CAPSULE | Freq: Every day | ORAL | Status: DC

## 2014-01-19 MED ORDER — ACETAMINOPHEN-CODEINE #3 300-30 MG PO TABS: 1.0000 | ORAL_TABLET | Freq: Three times a day (TID) | ORAL | Status: DC | PRN

## 2014-01-19 MED ORDER — ALPRAZOLAM 0.25 MG PO TABS: 0.2500 mg | ORAL_TABLET | Freq: Two times a day (BID) | ORAL | Status: DC | PRN

## 2014-01-19 NOTE — Progress Notes (Signed)
Establish Care HFU Diverticulitis Complaining of low abdominal pain Sometime pain with urination no burning

## 2014-01-19 NOTE — Assessment & Plan Note (Signed)
2. ? Kidney stones: Tylenol #3 as needed for pain flomax to help pass stone if passing. Strain urine and collect any stones in cup for analysis.

## 2014-01-19 NOTE — Progress Notes (Signed)
   Subjective:    Patient ID: Sonia Bennett, female    DOB: [birth year], 38 y.o.   MRN: [id number] CC: HFU for sigmoid diverticulitis with abscess, L nephrolithiasis, mood  HPI  38 yo F  1. Sigmoid diverticulitis with abscess: improving. No fever, chills, N/V. Finished augmentin. 2 flagyl pills left. Having some b/l LQ pain. Out of percocet.   2. L  Nephrolithiasis: noted darkening of urine. Having some LUQ pain that is radiating down towards groin and slight dysuria. No gross hematuria. No flank pain.   3. Mood: depressed and anxious for at least 3 months. Dad with cancer, mom died rather recently, husband recently very ill and hospitalized. 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 Feeling overwhelmed. Poor sleep. No SI. Has been on prozac 40 mg daily in the past for depression symptoms which helped significantly. Denies ETOH and illicit drug use.    Med Hx: nephropathy during pregnancy Surg Hx: negative  Soc Hx: current smoker  Review of Systems GAD 7 score of 17. 2-2,5-7, 3-1,3,4/     Objective:   Physical Exam BP 123/85 mmHg  Pulse 96  Temp(Src) 98.7 F (37.1 C) (Oral)  Resp 18  Ht 5\' 7"  (1.702 m)  Wt 293 lb (132.904 kg)  BMI 45.88 kg/m2  SpO2 100% General appearance: alert, cooperative and icteric Back: symmetric, no curvature. ROM normal. No CVA tenderness. Lungs: clear to auscultation bilaterally Heart: regular rate and rhythm, S1, S2 normal, no murmur, click, rub or gallop Abdomen: normal findings: bowel sounds normal and no masses palpable and abnormal findings:  obese and mild tenderness in the RLQ and in the LUQ     Assessment & Plan:

## 2014-01-19 NOTE — Patient Instructions (Addendum)
Mrs. Sonia Bennett,  Thank you for coming in today. It was a pleasure meeting you. I look forward to being your primary doctor.   1. Depression and anxiety: prozac 20 mg daily, xanax in AM with prozac for first week, also before bed as needed.  After first week this as needed.   2. ? Kidney stones: Tylenol #3 as needed for pain flomax to help pass stone if passing. Strain urine and collect any stones in cup for analysis.   F/u in 3 weeks Return to ED for vomiting, fever, severe abdominal pain.   Dr. Armen PickupFunches

## 2014-01-19 NOTE — Assessment & Plan Note (Signed)
1. Depression and anxiety: prozac 20 mg daily, xanax in AM with prozac for first week, also before bed as needed.  After first week this as needed.

## 2014-01-19 NOTE — Assessment & Plan Note (Signed)
A: symptomatic improvement. P: Monitor s.s Finish flagyl Reviewed s/s to prompt return to ED

## 2014-01-19 NOTE — Assessment & Plan Note (Signed)
A: symptomatic improvement. P: Monitor s.s Finish flagyl Reviewed s/s to prompt return to ED  

## 2014-02-13 ENCOUNTER — Ambulatory Visit: Payer: Medicaid Other

## 2014-02-20 ENCOUNTER — Other Ambulatory Visit: Payer: Self-pay | Admitting: Family Medicine

## 2014-02-21 ENCOUNTER — Encounter: Payer: Self-pay | Admitting: Family Medicine

## 2014-02-21 ENCOUNTER — Ambulatory Visit: Payer: Self-pay | Attending: Family Medicine | Admitting: Family Medicine

## 2014-02-21 VITALS — BP 109/76 | HR 100 | Temp 97.7°F | Resp 16 | Ht 67.0 in | Wt 291.0 lb

## 2014-02-21 DIAGNOSIS — F329 Major depressive disorder, single episode, unspecified: Secondary | ICD-10-CM | POA: Insufficient documentation

## 2014-02-21 DIAGNOSIS — F418 Other specified anxiety disorders: Secondary | ICD-10-CM

## 2014-02-21 DIAGNOSIS — K5732 Diverticulitis of large intestine without perforation or abscess without bleeding: Secondary | ICD-10-CM

## 2014-02-21 DIAGNOSIS — F32A Depression, unspecified: Secondary | ICD-10-CM

## 2014-02-21 DIAGNOSIS — F419 Anxiety disorder, unspecified: Secondary | ICD-10-CM | POA: Insufficient documentation

## 2014-02-21 DIAGNOSIS — F17210 Nicotine dependence, cigarettes, uncomplicated: Secondary | ICD-10-CM | POA: Insufficient documentation

## 2014-02-21 DIAGNOSIS — Z6841 Body Mass Index (BMI) 40.0 and over, adult: Secondary | ICD-10-CM | POA: Insufficient documentation

## 2014-02-21 DIAGNOSIS — N200 Calculus of kidney: Secondary | ICD-10-CM

## 2014-02-21 LAB — POCT URINALYSIS DIPSTICK
Bilirubin, UA: NEGATIVE
GLUCOSE UA: NEGATIVE
Ketones, UA: NEGATIVE
NITRITE UA: NEGATIVE
Protein, UA: NEGATIVE
Spec Grav, UA: 1.015
Urobilinogen, UA: 0.2
pH, UA: 5.5

## 2014-02-21 MED ORDER — TRAZODONE HCL 50 MG PO TABS: 50.0000 mg | ORAL_TABLET | Freq: Every day | ORAL | Status: DC

## 2014-02-21 MED ORDER — FLUOXETINE HCL 20 MG PO TABS: 40.0000 mg | ORAL_TABLET | Freq: Every day | ORAL | Status: DC

## 2014-02-21 NOTE — Assessment & Plan Note (Addendum)
2. Kidney stones: there is still microscopic blood in your urine suggesting that you are still passing stones.   Continue to drink plenty of water.  Strain urine and call for follow up  if you develop pain or blood you can see with your eye.

## 2014-02-21 NOTE — Assessment & Plan Note (Signed)
1. Depression and anxiety: Continue prozac, increase to 40 mg daily Add trazodone 50 mg at night to help with mood symptoms and insomnia.

## 2014-02-21 NOTE — Progress Notes (Signed)
Pt here to follow up on diverticulitis and kidney stones Is still smoking 1 ppd ready to quit interested in aids to help Pain in lower abdomen 5/10 Patient needs med refills

## 2014-02-21 NOTE — Progress Notes (Signed)
   Subjective:    Patient ID: Sonia Bennett, female    DOB: 09-01-1975, 39 y.o.   MRN: [id number] CC: f/u depression and anxiety,  HPI 39 yo F presents for f/u appt:  1. Depression and anxiety: improved. Denies SI. Tolerating prozac 20 mg daily. Admits to poor sleep and fatigue.   2. Renal stones: strained urine collected some debris. No hematuria. No flank pain or dysuria. Finished flomax 2 days ago.  3. Sigmoid diverticulitis: completed antibiotics and probiotics. No fever, chills, nausea, emesis, diarrhea or melena. Still with LLQ pains with BM only. Denies constipation. Mindful of diet.   Soc Hx: current smoker, 1 PPD  Review of Systems As per HPI   Objective:   Physical Exam BP 109/76 mmHg  Pulse 100  Temp(Src) 97.7 F (36.5 C)  Resp 16  Ht 5\' 7"  (1.702 m)  Wt 291 lb (131.997 kg)  BMI 45.57 kg/m2  SpO2 100% General appearance: alert, cooperative, no distress and morbidly obese Lungs: clear to auscultation bilaterally Heart: regular rate and rhythm, S1, S2 normal, no murmur, click, rub or gallop Abdomen: soft, non-tender; bowel sounds normal; no masses,  no organomegaly     Assessment & Plan:

## 2014-02-21 NOTE — Patient Instructions (Addendum)
Mrs. Karin Goldenthey,  Thank you for coming back in to see me today.  1. Depression and anxiety: Continue prozac, increase to 40 mg daily Add trazodone 50 mg at night to help with mood symptoms and insomnia.   2. Kidney stones: there is still microscopic blood in your urine suggesting that you are still passing stones.   Continue to drink plenty of water.  Strain urine and call for follow up  if you develop pain or blood you can see with your eye.   3. Diverticulitis prevention diet:  Should I change my diet if I have had diverticulitis? - If you have had diverticulitis, it's a good idea to eat a lot of fiber and avoid red meat and foods that are high in fat. Good sources of fiber include fruits, oats, beans, peas, and green leafy vegetables. If you do not already eat fiber-rich foods, wait until after your symptoms get better to start.    F/u in 3-4 weeks  Dr. Armen PickupFunches

## 2014-02-21 NOTE — Assessment & Plan Note (Signed)
3. Diverticulitis prevention diet:  Should I change my diet if I have had diverticulitis? - If you have had diverticulitis, it's a good idea to eat a lot of fiber and avoid red meat and foods that are high in fat. Good sources of fiber include fruits, oats, beans, peas, and green leafy vegetables. If you do not already eat fiber-rich foods, wait until after your symptoms get better to start.

## 2014-04-04 ENCOUNTER — Ambulatory Visit: Payer: Self-pay | Admitting: Family Medicine

## 2014-04-06 ENCOUNTER — Encounter: Payer: Self-pay | Admitting: Family Medicine

## 2014-04-06 ENCOUNTER — Ambulatory Visit: Payer: No Typology Code available for payment source | Attending: Family Medicine | Admitting: Family Medicine

## 2014-04-06 VITALS — BP 116/80 | HR 74 | Temp 98.7°F | Resp 16 | Ht 67.0 in | Wt 287.0 lb

## 2014-04-06 DIAGNOSIS — N200 Calculus of kidney: Secondary | ICD-10-CM

## 2014-04-06 DIAGNOSIS — F418 Other specified anxiety disorders: Secondary | ICD-10-CM | POA: Diagnosis not present

## 2014-04-06 DIAGNOSIS — F17200 Nicotine dependence, unspecified, uncomplicated: Secondary | ICD-10-CM | POA: Insufficient documentation

## 2014-04-06 DIAGNOSIS — F419 Anxiety disorder, unspecified: Secondary | ICD-10-CM | POA: Insufficient documentation

## 2014-04-06 DIAGNOSIS — Z124 Encounter for screening for malignant neoplasm of cervix: Secondary | ICD-10-CM

## 2014-04-06 DIAGNOSIS — Z23 Encounter for immunization: Secondary | ICD-10-CM | POA: Diagnosis not present

## 2014-04-06 DIAGNOSIS — K5792 Diverticulitis of intestine, part unspecified, without perforation or abscess without bleeding: Secondary | ICD-10-CM | POA: Insufficient documentation

## 2014-04-06 DIAGNOSIS — F329 Major depressive disorder, single episode, unspecified: Secondary | ICD-10-CM | POA: Insufficient documentation

## 2014-04-06 DIAGNOSIS — Z90710 Acquired absence of both cervix and uterus: Secondary | ICD-10-CM | POA: Insufficient documentation

## 2014-04-06 DIAGNOSIS — F32A Depression, unspecified: Secondary | ICD-10-CM

## 2014-04-06 DIAGNOSIS — Z114 Encounter for screening for human immunodeficiency virus [HIV]: Secondary | ICD-10-CM

## 2014-04-06 MED ORDER — TRAZODONE HCL 50 MG PO TABS: 50.0000 mg | ORAL_TABLET | Freq: Every day | ORAL | Status: AC

## 2014-04-06 MED ORDER — FLUOXETINE HCL 20 MG PO TABS: 40.0000 mg | ORAL_TABLET | Freq: Every day | ORAL | Status: DC

## 2014-04-06 MED ORDER — ACETAMINOPHEN-CODEINE #3 300-30 MG PO TABS: 1.0000 | ORAL_TABLET | Freq: Three times a day (TID) | ORAL | Status: DC | PRN

## 2014-04-06 NOTE — Assessment & Plan Note (Signed)
A; improved Med: compliant P: continue current regimen

## 2014-04-06 NOTE — Progress Notes (Signed)
Patient reports anxiety is better Patient still c/o sharp pain in lower abdomen when she needs to have a BM Patient has been following the diet recommended with high fiber Patient will take Tdap

## 2014-04-06 NOTE — Progress Notes (Signed)
   Subjective:    Patient ID: Sonia Bennett, female    DOB: [birth year], 39 y.o.   MRN: [id number] CC: f/u anxiety and depression  HPI 39 yo F f/u:  1. Anxiety and depression: improved. No SI. Some GI upset, LLQ cramping. Would like to continue medication.  2. Diverticulitis: as per above. No blood in stool, diarrhea or constipation.  3. HM: due for pap and HIV. No abnormal discharge or pelvic pain.   Soc Hx:  Current smoker  Surg Hx: s/p hysterectomy in 2005 for ovarian cyst. Hx of abnormal pap in early 20s.  Review of Systems As per HPI     Objective:   Physical Exam BP 116/80 mmHg  Pulse 74  Temp(Src) 98.7 F (37.1 C)  Resp 16  Ht 5\' 7"  (1.702 m)  Wt 287 lb (130.182 kg)  BMI 44.94 kg/m2  SpO2 100%  Wt Readings from Last 3 Encounters:  04/06/14 287 lb (130.182 kg)  02/21/14 291 lb (131.997 kg)  01/19/14 293 lb (132.904 kg)  General appearance: alert, cooperative and no distress Abdomen: soft, non-tender; bowel sounds normal; no masses,  no organomegaly Pelvic: cervix normal in appearance, external genitalia normal, no adnexal masses or tenderness, rectovaginal septum normal, uterus normal size, shape, and consistency, uterus surgically absent, vagina normal without discharge and cervix surgically absent. pap done of vaginal cuff        Assessment & Plan:   Will obtain records of patient's hysterectomy to review pathology

## 2014-04-06 NOTE — Assessment & Plan Note (Signed)
Pap of vaginal cuff done today  

## 2014-04-06 NOTE — Patient Instructions (Addendum)
Mrs. Karin Goldenthey,  Pap done today. Absent cervix and uterus. Otherwise normal exam.   Screening HIV  Refilled trazodone and prozac.  F/u in 6 months, sooner if needed  Dr. Armen PickupFunches

## 2014-04-06 NOTE — Assessment & Plan Note (Signed)
Screening HIV ordered  

## 2014-04-07 LAB — HIV ANTIBODY (ROUTINE TESTING W REFLEX): HIV: NONREACTIVE

## 2014-04-09 ENCOUNTER — Telehealth: Payer: Self-pay | Admitting: *Deleted

## 2014-04-09 LAB — CERVICOVAGINAL ANCILLARY ONLY
Chlamydia: NEGATIVE
Neisseria Gonorrhea: NEGATIVE
WET PREP (BD AFFIRM): NEGATIVE
WET PREP (BD AFFIRM): NEGATIVE
WET PREP (BD AFFIRM): NEGATIVE

## 2014-04-09 LAB — CYTOLOGY - PAP

## 2014-04-09 NOTE — Telephone Encounter (Signed)
Left voice message with normal results, next pap in % years If any question return call

## 2014-04-09 NOTE — Telephone Encounter (Signed)
-----   Message from Lora PaulaJosalyn C Funches, MD sent at 04/09/2014  3:49 PM EST ----- Neg [email or web address] Neg pap repeat in 5 years

## 2014-04-09 NOTE — Telephone Encounter (Signed)
-----   Message from Lora PaulaJosalyn C Funches, MD sent at 04/09/2014  8:50 AM EST ----- Neg wet prep

## 2014-09-20 ENCOUNTER — Encounter (HOSPITAL_BASED_OUTPATIENT_CLINIC_OR_DEPARTMENT_OTHER): Payer: Self-pay | Admitting: *Deleted

## 2014-09-20 ENCOUNTER — Emergency Department (HOSPITAL_BASED_OUTPATIENT_CLINIC_OR_DEPARTMENT_OTHER)
Admission: EM | Admit: 2014-09-20 | Discharge: 2014-09-20 | Discharge status: Against Medical Advice | Payer: No Typology Code available for payment source | Attending: Emergency Medicine | Admitting: Emergency Medicine

## 2014-09-20 DIAGNOSIS — J45909 Unspecified asthma, uncomplicated: Secondary | ICD-10-CM | POA: Insufficient documentation

## 2014-09-20 DIAGNOSIS — R197 Diarrhea, unspecified: Secondary | ICD-10-CM | POA: Insufficient documentation

## 2014-09-20 DIAGNOSIS — M549 Dorsalgia, unspecified: Secondary | ICD-10-CM | POA: Insufficient documentation

## 2014-09-20 DIAGNOSIS — Z720 Tobacco use: Secondary | ICD-10-CM | POA: Insufficient documentation

## 2014-09-20 LAB — URINALYSIS, ROUTINE W REFLEX MICROSCOPIC
BILIRUBIN URINE: NEGATIVE
Glucose, UA: NEGATIVE mg/dL
KETONES UR: NEGATIVE mg/dL
NITRITE: NEGATIVE
PH: 5.5 (ref 5.0–8.0)
Protein, ur: NEGATIVE mg/dL
SPECIFIC GRAVITY, URINE: 1.009 (ref 1.005–1.030)
Urobilinogen, UA: 0.2 mg/dL (ref 0.0–1.0)

## 2014-09-20 LAB — URINE MICROSCOPIC-ADD ON

## 2014-09-20 NOTE — ED Notes (Signed)
Back pain and diarrhea since yesterday. She took AZO OTC with some relief.

## 2014-11-07 ENCOUNTER — Emergency Department (HOSPITAL_BASED_OUTPATIENT_CLINIC_OR_DEPARTMENT_OTHER)
Admission: EM | Admit: 2014-11-07 | Discharge: 2014-11-08 | Disposition: A | Discharge status: Home / Self Care | Payer: No Typology Code available for payment source | Attending: Emergency Medicine | Admitting: Emergency Medicine

## 2014-11-07 ENCOUNTER — Encounter (HOSPITAL_BASED_OUTPATIENT_CLINIC_OR_DEPARTMENT_OTHER): Payer: Self-pay

## 2014-11-07 DIAGNOSIS — Z87442 Personal history of urinary calculi: Secondary | ICD-10-CM | POA: Insufficient documentation

## 2014-11-07 DIAGNOSIS — Z3202 Encounter for pregnancy test, result negative: Secondary | ICD-10-CM | POA: Insufficient documentation

## 2014-11-07 DIAGNOSIS — Z79899 Other long term (current) drug therapy: Secondary | ICD-10-CM | POA: Insufficient documentation

## 2014-11-07 DIAGNOSIS — J45909 Unspecified asthma, uncomplicated: Secondary | ICD-10-CM | POA: Insufficient documentation

## 2014-11-07 DIAGNOSIS — Z8719 Personal history of other diseases of the digestive system: Secondary | ICD-10-CM | POA: Insufficient documentation

## 2014-11-07 DIAGNOSIS — N390 Urinary tract infection, site not specified: Secondary | ICD-10-CM | POA: Insufficient documentation

## 2014-11-07 LAB — URINALYSIS, ROUTINE W REFLEX MICROSCOPIC
Bilirubin Urine: NEGATIVE
Glucose, UA: NEGATIVE mg/dL
Ketones, ur: NEGATIVE mg/dL
Nitrite: NEGATIVE
PROTEIN: NEGATIVE mg/dL
SPECIFIC GRAVITY, URINE: 1.024 (ref 1.005–1.030)
UROBILINOGEN UA: 0.2 mg/dL (ref 0.0–1.0)
pH: 5 (ref 5.0–8.0)

## 2014-11-07 LAB — URINE MICROSCOPIC-ADD ON

## 2014-11-07 LAB — PREGNANCY, URINE: PREG TEST UR: NEGATIVE

## 2014-11-07 MED ORDER — PHENAZOPYRIDINE HCL 100 MG PO TABS
200.0000 mg | ORAL_TABLET | Freq: Once | ORAL | Status: AC
  Administered 2014-11-08: 200 mg via ORAL
  Filled 2014-11-07: qty 2

## 2014-11-07 MED ORDER — GI COCKTAIL ~~LOC~~
30.0000 mL | Freq: Once | ORAL | Status: AC
  Administered 2014-11-08: 30 mL via ORAL
  Filled 2014-11-07: qty 30

## 2014-11-07 MED ORDER — KETOROLAC TROMETHAMINE 60 MG/2ML IM SOLN
60.0000 mg | Freq: Once | INTRAMUSCULAR | Status: AC
  Administered 2014-11-08: 60 mg via INTRAMUSCULAR
  Filled 2014-11-07: qty 2

## 2014-11-07 MED ORDER — NITROFURANTOIN MONOHYD MACRO 100 MG PO CAPS
100.0000 mg | ORAL_CAPSULE | Freq: Once | ORAL | Status: AC
  Administered 2014-11-08: 100 mg via ORAL
  Filled 2014-11-07: qty 1

## 2014-11-07 NOTE — ED Notes (Signed)
Pt c/o lower left back pain for over a week which has started causing "tightness" in her left side and lower abdominal pain that started yesterday.  Pt denies dysuria, denies back injury, c/o mild nausea.

## 2014-11-07 NOTE — ED Provider Notes (Signed)
CSN: [id number]     Arrival date & time 11/07/14  2252 History  By signing my name below, I, [name], attest that this documentation has been prepared under the direction and in the presence of April Palumbo, MD. Electronically Signed: [name], ED Scribe. 11/07/2014. 11:55 PM.   Chief Complaint  Patient presents with  . Back Pain      Patient is a 39 y.o. female presenting with back pain. The history is provided by the patient. No language interpreter was used.  Back Pain Location:  Sacro-iliac joint Quality:  Aching Radiates to:  Does not radiate Pain severity:  Moderate Pain is:  Same all the time Onset quality:  Gradual Duration:  1 week Timing:  Constant Progression:  Worsening Chronicity:  New Context: not falling and not recent injury   Relieved by:  Nothing Worsened by:  Nothing tried Ineffective treatments: BC powder. Associated symptoms: no abdominal pain, no abdominal swelling, no bladder incontinence, no bowel incontinence, no chest pain, no dysuria, no fever, no headaches, no leg pain, no numbness, no paresthesias, no pelvic pain, no perianal numbness, no tingling, no weakness and no weight loss   Risk factors: not pregnant     HPI Comments: Sonia Bennett is a 39 y.o. female who presents to the Emergency Department complaining of achy lower left back pain onset 1 week. She notes that her left lower back pain is now causing her to have muscle spasm sensation to her sides. She denies any trauma or injury to her back. She denies any new sexual partners. She states that she is having associated symptoms of urinary hesistancy. She states that she has tried [facility] powders with no relief for her symptoms. Pt denies dysuria, frequency, vaginal discharge, and any other symptoms. She notes that she is allergic to Vicodin.    Past Medical History  Diagnosis Date  . Diverticulitis   . Asthma Dx 2008  . Kidney calculi    Past Surgical History  Procedure Laterality Date   . Abdominal hysterectomy  2005   Family History  Problem Relation Age of Onset  . COPD Mother   . Heart disease Mother   . Depression Mother   . Cancer Father   . Heart disease Father   . Depression Father    Social History  Substance Use Topics  . Smoking status: Current Every Day Smoker -- 1.00 packs/day    Types: Cigarettes  . Smokeless tobacco: None  . Alcohol Use: No   OB History    No data available     Review of Systems  Constitutional: Negative for fever and weight loss.  Cardiovascular: Negative for chest pain.  Gastrointestinal: Positive for nausea (mild) and diarrhea. Negative for abdominal pain and bowel incontinence.  Genitourinary: Negative for bladder incontinence, dysuria, frequency, hematuria, flank pain, vaginal discharge and pelvic pain.       Urinary hesistancy  Musculoskeletal: Positive for back pain.  Neurological: Negative for tingling, weakness, numbness, headaches and paresthesias.  All other systems reviewed and are negative.     Allergies  Vicodin  Home Medications   Prior to Admission medications   Medication Sig Start Date End Date Taking? Authorizing Provider  acetaminophen-codeine (TYLENOL #3) 300-30 MG per tablet Take 1 tablet by mouth every 8 (eight) hours as needed for moderate pain. 04/06/14   Josalyn Funches, MD  FLUoxetine (PROZAC) 20 MG tablet Take 2 tablets (40 mg total) by mouth daily. 04/06/14   Dessa Phi, MD  traZODone (DESYREL)  50 MG tablet Take 1 tablet (50 mg total) by mouth at bedtime. 04/06/14   Josalyn Funches, MD   BP 109/65 mmHg  Pulse 76  Temp(Src) 98.1 F (36.7 C) (Oral)  Resp 18  Ht  (1.702 m)  Wt 290 lb (131.543 kg)  BMI 45.41 kg/m2  SpO2 100% Physical Exam  Constitutional: She is oriented to person, place, and time. She appears well-developed and well-nourished. No distress.  HENT:  Head: Normocephalic and atraumatic.  Mouth/Throat: Oropharynx is clear and moist.  Eyes: EOM are normal. Pupils  are equal, round, and reactive to light.  Neck: Normal range of motion. Neck supple.  Cardiovascular: Normal rate, regular rhythm, normal heart sounds and intact distal pulses.  Exam reveals no gallop and no friction rub.   No murmur heard. Pulmonary/Chest: Effort normal. No respiratory distress. She has no wheezes. She has no rales.  Abdominal: Soft. Bowel sounds are increased. There is no tenderness. There is no rebound and no guarding.  Hyperactive bowel sounds throughout  Musculoskeletal: Normal range of motion. She exhibits no edema or tenderness.  No step off or crepitus of C, T, or L spine. No CVA tenderness. No palpable spasm.  Neurological: She is alert and oriented to person, place, and time. She has normal reflexes.  Skin: Skin is warm and dry.  Psychiatric: She has a normal mood and affect. Her behavior is normal.  Nursing note and vitals reviewed.   ED Course  Procedures (including critical care time) DIAGNOSTIC STUDIES: Oxygen Saturation is 100% on RA, nl by my interpretation.    COORDINATION OF CARE: 11:54 PM Discussed treatment plan with pt at bedside which includes UA, toradol, macrobid, and pyridium and pt agreed to plan.    Labs Review Labs Reviewed  URINALYSIS, ROUTINE W REFLEX MICROSCOPIC (NOT AT [facility]) - Abnormal; Notable for the following:    APPearance HAZY (*)    Hgb urine dipstick MODERATE (*)    Leukocytes, UA MODERATE (*)    All other components within normal limits  URINE MICROSCOPIC-ADD ON - Abnormal; Notable for the following:    Squamous Epithelial / LPF FEW (*)    Bacteria, UA MANY (*)    All other components within normal limits  PREGNANCY, URINE    Imaging Review No results found. I have personally reviewed and evaluated these lab results as part of my medical decision-making.   EKG Interpretation None      MDM   Final diagnoses:  None    Results for orders placed or performed during the hospital encounter of 11/07/14   Urinalysis, Routine w reflex microscopic (not at [facility])  Result Value Ref Range   Color, Urine YELLOW YELLOW   APPearance HAZY (A) CLEAR   Specific Gravity, Urine 1.024 1.005 - 1.030   pH 5.0 5.0 - 8.0   Glucose, UA NEGATIVE NEGATIVE mg/dL   Hgb urine dipstick MODERATE (A) NEGATIVE   Bilirubin Urine NEGATIVE NEGATIVE   Ketones, ur NEGATIVE NEGATIVE mg/dL   Protein, ur NEGATIVE NEGATIVE mg/dL   Urobilinogen, UA 0.2 0.0 - 1.0 mg/dL   Nitrite NEGATIVE NEGATIVE   Leukocytes, UA MODERATE (A) NEGATIVE  Pregnancy, urine  Result Value Ref Range   Preg Test, Ur NEGATIVE NEGATIVE  Urine microscopic-add on  Result Value Ref Range   Squamous Epithelial / LPF FEW (A) RARE   WBC, UA 11-20 <3 WBC/hpf   RBC / HPF 7-10 <3 RBC/hpf   Bacteria, UA MANY (A) RARE   No results found.  Medications  ketorolac (TORADOL) injection 60 mg (60 mg Intramuscular Given 11/08/14 0010)  nitrofurantoin (macrocrystal-monohydrate) (MACROBID) capsule 100 mg (100 mg Oral Given 11/08/14 0010)  phenazopyridine (PYRIDIUM) tablet 200 mg (200 mg Oral Given 11/08/14 0010)  gi cocktail (Maalox,Lidocaine,Donnatal) (30 mLs Oral Given 11/08/14 0010)   UTI will treat with pyridium, macrobid and NSAIDs.  Close follow up with your PMD.  Strict return precautions given  I personally performed the services described in this documentation, which was scribed in my presence. The recorded information has been reviewed and is accurate.     Cy Blamer, MD 11/08/14 [phone]

## 2014-11-08 ENCOUNTER — Encounter (HOSPITAL_BASED_OUTPATIENT_CLINIC_OR_DEPARTMENT_OTHER): Payer: Self-pay | Admitting: Emergency Medicine

## 2014-11-08 MED ORDER — PHENAZOPYRIDINE HCL 200 MG PO TABS: 200.0000 mg | ORAL_TABLET | Freq: Three times a day (TID) | ORAL | Status: DC

## 2014-11-08 MED ORDER — MELOXICAM 15 MG PO TABS: 15.0000 mg | ORAL_TABLET | Freq: Every day | ORAL | Status: DC

## 2014-11-08 MED ORDER — NITROFURANTOIN MONOHYD MACRO 100 MG PO CAPS: 100.0000 mg | ORAL_CAPSULE | Freq: Two times a day (BID) | ORAL | Status: AC

## 2014-11-08 NOTE — ED Notes (Signed)
Pt verbalizes understanding of d/c instructions and denies any further needs at this time. 

## 2014-11-13 ENCOUNTER — Other Ambulatory Visit: Payer: Self-pay | Admitting: Family Medicine

## 2014-11-15 ENCOUNTER — Other Ambulatory Visit: Payer: Self-pay

## 2014-11-15 DIAGNOSIS — F419 Anxiety disorder, unspecified: Principal | ICD-10-CM

## 2014-11-15 DIAGNOSIS — F32A Depression, unspecified: Secondary | ICD-10-CM

## 2014-11-15 DIAGNOSIS — F329 Major depressive disorder, single episode, unspecified: Secondary | ICD-10-CM

## 2014-11-16 MED ORDER — FLUOXETINE HCL 20 MG PO TABS: 40.0000 mg | ORAL_TABLET | Freq: Every day | ORAL | Status: AC

## 2014-11-16 NOTE — Telephone Encounter (Signed)
59 with zero refills f/u appt needed

## 2015-03-13 IMAGING — CT CT GUIDANCE NEEDLE PLACEMENT
1 of 15 series · 3 of 32 positions shown, 7 images · non-contrast
Comparison: none

CLINICAL DATA: Pelvic diverticular abscess

[Series 14: add scan 5.0 b40f · axial · 0.76mm/px · z∈[-208,-182]mm · 3 of 6 slices shown, 7 images]
[im 1/6  soft-tissue]
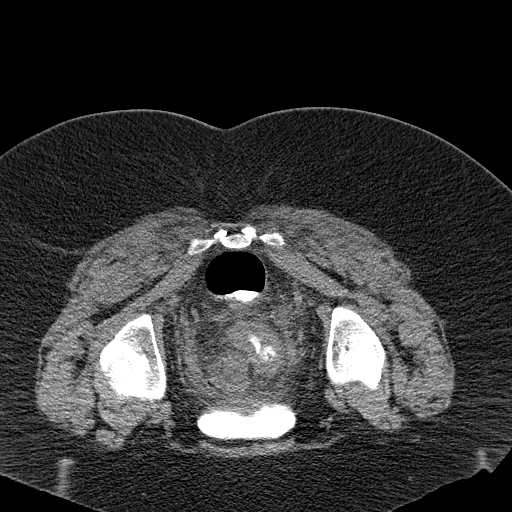
[im 1/6  lung]
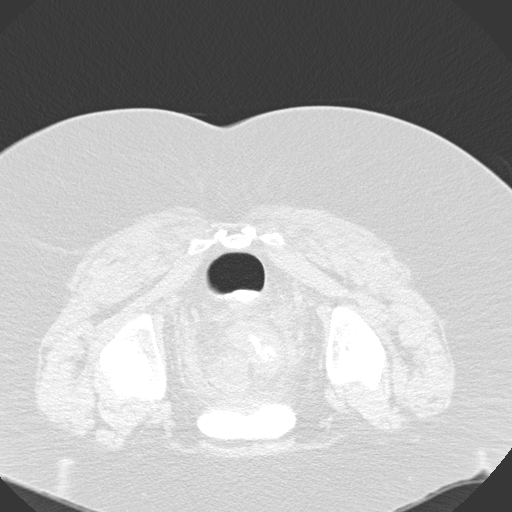
[im 1/6  bone]
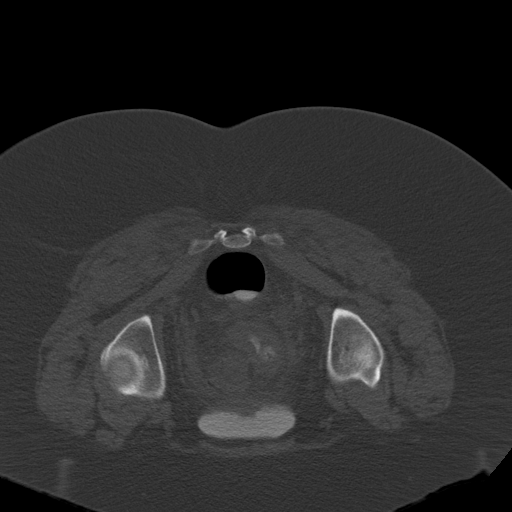
[im 3/6  soft-tissue]
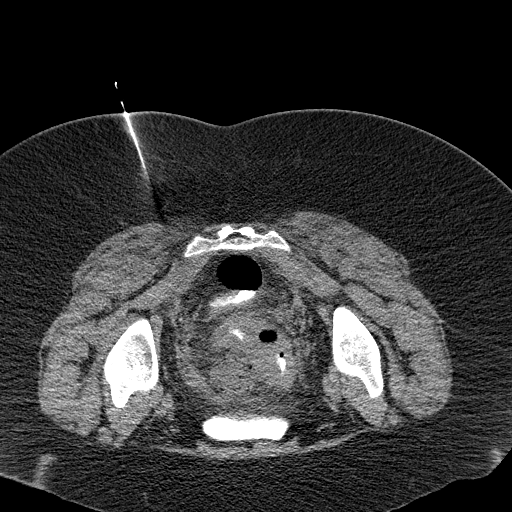
[im 3/6  lung]
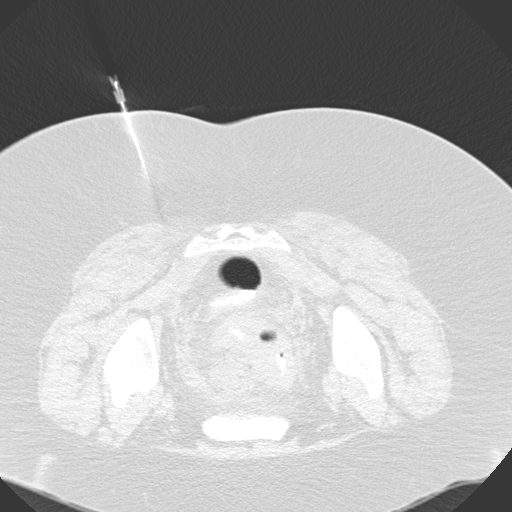
[im 6/6  soft-tissue]
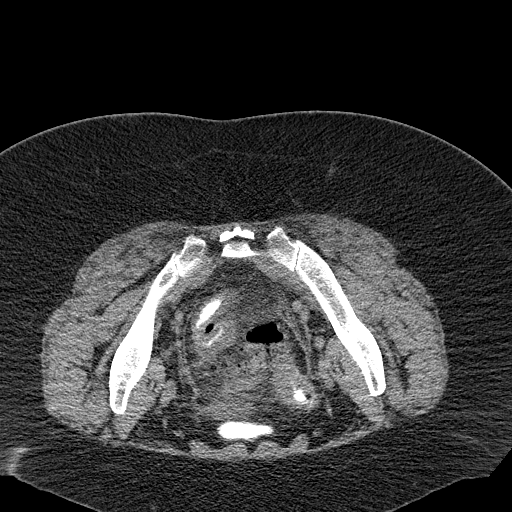
[im 6/6  lung]
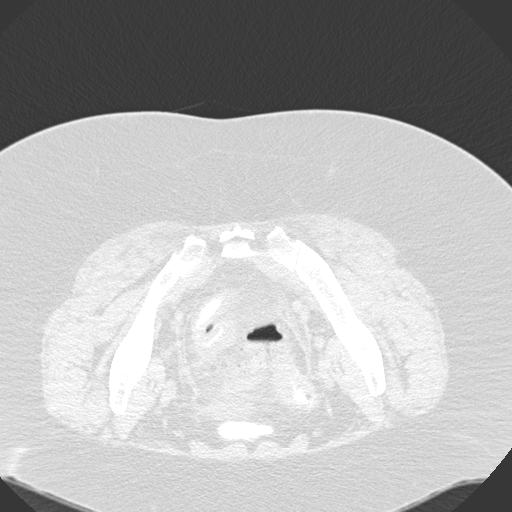

[3 of 32 positions shown; findings below may reference images not displayed]

EXAM:
CT GUIDED DRAINAGE OF PELVIC DIVERTICULAR ABSCESS

ANESTHESIA/SEDATION:
Four Mg IV Versed 175 mcg IV Fentanyl

Total Moderate Sedation Time:  25 minutes

PROCEDURE:
The procedure, risks, benefits, and alternatives were explained to
the patient. Questions regarding the procedure were encouraged and
answered. The patient understands and consents to the procedure.

The right trans gluteal area was prepped with Betadinein a sterile
fashion, and a sterile drape was applied covering the operative
field. A sterile gown and sterile gloves were used for the
procedure. Local anesthesia was provided with 1% Lidocaine.

Previous imaging reviewed. Patient positioned prone. Noncontrast
localization CT performed through the pelvis. The small pericolonic
diverticular abscess was localized. From a right trans gluteal
approach, a 20 cm 18 gauge access needle was advanced percutaneously
under CT guidance into the small abscess. Needle position traverses
very close to the colon wall on the right side. Needle position is
confirmed within the abscess cavity. Syringe aspiration yielded 35
cc purulent fluid. Sample sent for Gram stain and culture. Because
of the percutaneous needle access close to the colonic wall on the
right side and the depth of the abscess, drain catheter was not
placed.(Risk of adjacent bowel injury). The abscess was nearly
completely collapsed by Needle aspiration.

COMPLICATIONS:
None immediate
FINDINGS: Imaging confirms needle placement in the diverticular pelvic abscess
for drainage
IMPRESSION: Successful CT-guided pelvic diverticular abscess needle aspiration
for drainage. Gram stain and culture sent.

## 2015-05-24 ENCOUNTER — Encounter (HOSPITAL_BASED_OUTPATIENT_CLINIC_OR_DEPARTMENT_OTHER): Payer: Self-pay | Admitting: *Deleted

## 2015-05-24 ENCOUNTER — Emergency Department (HOSPITAL_BASED_OUTPATIENT_CLINIC_OR_DEPARTMENT_OTHER)
Admission: EM | Admit: 2015-05-24 | Discharge: 2015-05-25 | Disposition: A | Discharge status: Home / Self Care | Payer: No Typology Code available for payment source | Attending: Dermatology | Admitting: Dermatology

## 2015-05-24 DIAGNOSIS — F17210 Nicotine dependence, cigarettes, uncomplicated: Secondary | ICD-10-CM | POA: Insufficient documentation

## 2015-05-24 DIAGNOSIS — Z5321 Procedure and treatment not carried out due to patient leaving prior to being seen by health care provider: Secondary | ICD-10-CM | POA: Insufficient documentation

## 2015-05-24 DIAGNOSIS — Z79899 Other long term (current) drug therapy: Secondary | ICD-10-CM | POA: Insufficient documentation

## 2015-05-24 DIAGNOSIS — J111 Influenza due to unidentified influenza virus with other respiratory manifestations: Secondary | ICD-10-CM | POA: Insufficient documentation

## 2015-05-24 DIAGNOSIS — J45909 Unspecified asthma, uncomplicated: Secondary | ICD-10-CM | POA: Insufficient documentation

## 2015-05-24 NOTE — ED Notes (Signed)
Pt c/o fu like symptoms x 1 day, tylenol PTA

## 2017-09-21 DIAGNOSIS — R079 Chest pain, unspecified: Secondary | ICD-10-CM

## 2017-09-21 DIAGNOSIS — Z720 Tobacco use: Secondary | ICD-10-CM

## 2017-09-21 DIAGNOSIS — R0789 Other chest pain: Secondary | ICD-10-CM

## 2017-09-21 DIAGNOSIS — R001 Bradycardia, unspecified: Secondary | ICD-10-CM

## 2017-09-21 DIAGNOSIS — R9431 Abnormal electrocardiogram [ECG] [EKG]: Secondary | ICD-10-CM

## 2017-09-21 DIAGNOSIS — R062 Wheezing: Secondary | ICD-10-CM
